# Patient Record
Sex: Female | Born: 1940 | Race: White | Hispanic: No | Smoking: Current every day smoker
Health system: Southern US, Community
[De-identification: ages and names within clinical notes are randomized; demographics above are authoritative.]

## PROBLEM LIST (undated history)

## (undated) DIAGNOSIS — J189 Pneumonia, unspecified organism: Secondary | ICD-10-CM

## (undated) DIAGNOSIS — G8929 Other chronic pain: Secondary | ICD-10-CM

## (undated) DIAGNOSIS — I739 Peripheral vascular disease, unspecified: Secondary | ICD-10-CM

## (undated) DIAGNOSIS — M549 Dorsalgia, unspecified: Secondary | ICD-10-CM

## (undated) DIAGNOSIS — Z720 Tobacco use: Secondary | ICD-10-CM

## (undated) DIAGNOSIS — K219 Gastro-esophageal reflux disease without esophagitis: Secondary | ICD-10-CM

## (undated) DIAGNOSIS — R091 Pleurisy: Secondary | ICD-10-CM

## (undated) DIAGNOSIS — I998 Other disorder of circulatory system: Secondary | ICD-10-CM

## (undated) DIAGNOSIS — I70229 Atherosclerosis of native arteries of extremities with rest pain, unspecified extremity: Secondary | ICD-10-CM

## (undated) HISTORY — DX: Atherosclerosis of native arteries of extremities with rest pain, unspecified extremity: I70.229

## (undated) HISTORY — PX: TOTAL ABDOMINAL HYSTERECTOMY: SHX209

## (undated) HISTORY — PX: TOTAL KNEE ARTHROPLASTY: SHX125

## (undated) HISTORY — DX: Tobacco use: Z72.0

## (undated) HISTORY — PX: TONSILLECTOMY: SUR1361

## (undated) HISTORY — DX: Other disorder of circulatory system: I99.8

## (undated) HISTORY — PX: JOINT REPLACEMENT: SHX530

## (undated) HISTORY — PX: INCONTINENCE SURGERY: SHX676

---
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
## 1979-07-01 HISTORY — PX: ABDOMINAL EXPOSURE: SHX5708

## 1998-03-26 ENCOUNTER — Other Ambulatory Visit: Admission: RE | Admit: 1998-03-26 | Discharge: 1998-03-26 | Payer: Self-pay | Admitting: Obstetrics and Gynecology

## 1998-04-05 ENCOUNTER — Inpatient Hospital Stay (HOSPITAL_COMMUNITY): Admission: RE | Admit: 1998-04-05 | Discharge: 1998-04-06 | Payer: Self-pay | Admitting: Obstetrics and Gynecology

## 2001-10-08 ENCOUNTER — Encounter: Admission: RE | Admit: 2001-10-08 | Discharge: 2001-10-08 | Payer: Self-pay | Admitting: Family Medicine

## 2001-10-08 ENCOUNTER — Encounter: Payer: Self-pay | Admitting: Family Medicine

## 2001-12-05 ENCOUNTER — Other Ambulatory Visit: Admission: RE | Admit: 2001-12-05 | Discharge: 2001-12-05 | Payer: Self-pay | Admitting: Obstetrics and Gynecology

## 2001-12-11 ENCOUNTER — Encounter: Payer: Self-pay | Admitting: Orthopedic Surgery

## 2001-12-16 ENCOUNTER — Inpatient Hospital Stay (HOSPITAL_COMMUNITY): Admission: RE | Admit: 2001-12-16 | Discharge: 2001-12-21 | Payer: Self-pay | Admitting: Orthopedic Surgery

## 2002-01-07 ENCOUNTER — Ambulatory Visit (HOSPITAL_COMMUNITY): Admission: RE | Admit: 2002-01-07 | Discharge: 2002-01-07 | Payer: Self-pay | Admitting: Orthopedic Surgery

## 2003-05-07 ENCOUNTER — Other Ambulatory Visit: Admission: RE | Admit: 2003-05-07 | Discharge: 2003-05-07 | Payer: Self-pay | Admitting: Obstetrics and Gynecology

## 2003-06-18 ENCOUNTER — Encounter: Payer: Self-pay | Admitting: Orthopedic Surgery

## 2003-06-22 ENCOUNTER — Inpatient Hospital Stay (HOSPITAL_COMMUNITY): Admission: RE | Admit: 2003-06-22 | Discharge: 2003-06-25 | Payer: Self-pay | Admitting: Orthopedic Surgery

## 2005-06-14 ENCOUNTER — Other Ambulatory Visit: Admission: RE | Admit: 2005-06-14 | Discharge: 2005-06-14 | Payer: Self-pay | Admitting: Obstetrics and Gynecology

## 2006-03-02 ENCOUNTER — Ambulatory Visit (HOSPITAL_COMMUNITY): Admission: RE | Admit: 2006-03-02 | Discharge: 2006-03-03 | Payer: Self-pay | Admitting: Obstetrics and Gynecology

## 2006-08-29 ENCOUNTER — Ambulatory Visit (HOSPITAL_COMMUNITY): Admission: RE | Admit: 2006-08-29 | Discharge: 2006-08-30 | Payer: Self-pay | Admitting: Obstetrics and Gynecology

## 2009-04-30 ENCOUNTER — Emergency Department (HOSPITAL_COMMUNITY): Admission: EM | Admit: 2009-04-30 | Discharge: 2009-04-30 | Payer: Self-pay | Admitting: Emergency Medicine

## 2010-10-08 ENCOUNTER — Emergency Department (HOSPITAL_COMMUNITY)
Admission: EM | Admit: 2010-10-08 | Discharge: 2010-10-08 | Payer: Self-pay | Source: Home / Self Care | Admitting: Emergency Medicine

## 2010-10-15 ENCOUNTER — Observation Stay (HOSPITAL_COMMUNITY)
Admission: EM | Admit: 2010-10-15 | Discharge: 2010-10-16 | Payer: Self-pay | Source: Home / Self Care | Attending: Internal Medicine | Admitting: Internal Medicine

## 2010-10-16 ENCOUNTER — Encounter (INDEPENDENT_AMBULATORY_CARE_PROVIDER_SITE_OTHER): Payer: Self-pay | Admitting: Internal Medicine
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
## 2011-01-09 LAB — POCT CARDIAC MARKERS
CKMB, poc: <1 ng/mL — ABNORMAL LOW (ref 1.0–8.0)
Myoglobin, poc: 34.4 ng/mL (ref 12–200)
Troponin i, poc: <0.05 ng/mL (ref 0.00–0.09)

## 2011-01-09 LAB — CBC
HCT: 38.2 % (ref 36.0–46.0)
Hemoglobin: 12.8 g/dL (ref 12.0–15.0)
MCH: 29.2 pg (ref 26.0–34.0)
MCHC: 33.5 g/dL (ref 30.0–36.0)
MCV: 87.2 fL (ref 78.0–100.0)
Platelets: 214 10*3/uL (ref 150–400)
RBC: 4.38 MIL/uL (ref 3.87–5.11)
RDW: 12.9 % (ref 11.5–15.5)
WBC: 8.4 10*3/uL (ref 4.0–10.5)

## 2011-01-09 LAB — POCT I-STAT, CHEM 8
BUN: 18 mg/dL (ref 6–23)
Calcium, Ion: 1.14 mmol/L (ref 1.12–1.32)
Chloride: 109 mEq/L (ref 96–112)
Creatinine, Ser: 0.8 mg/dL (ref 0.4–1.2)
Glucose, Bld: 114 mg/dL — ABNORMAL HIGH (ref 70–99)
HCT: 38 % (ref 36.0–46.0)
Hemoglobin: 12.9 g/dL (ref 12.0–15.0)
Potassium: 3.8 mEq/L (ref 3.5–5.1)
Sodium: 141 mEq/L (ref 135–145)
TCO2: 25 mmol/L (ref 0–100)

## 2011-01-09 LAB — LIPID PANEL
Cholesterol: 195 mg/dL (ref 0–200)
HDL: 59 mg/dL (ref >39)
LDL Cholesterol: 120 mg/dL — ABNORMAL HIGH (ref 0–99)
Total CHOL/HDL Ratio: 3.3 RATIO
VLDL: 16 mg/dL (ref 0–40)

## 2011-01-09 LAB — CARDIAC PANEL(CRET KIN+CKTOT+MB+TROPI)
Relative Index: INVALID (ref 0.0–2.5)
Total CK: 88 U/L (ref 7–177)
Troponin I: 0.01 ng/mL (ref 0.00–0.06)
Troponin I: 0.03 ng/mL (ref 0.00–0.06)

## 2011-01-09 LAB — CK TOTAL AND CKMB (NOT AT ARMC): CK, MB: 1.9 ng/mL (ref 0.3–4.0)

## 2011-01-09 LAB — TROPONIN I: Troponin I: 0.03 ng/mL (ref 0.00–0.06)

## 2011-03-17 NOTE — Op Note (Signed)
[place]  Patient:    [name], [name] Visit Number: [id number] MRN: [id number]          Service Type: SUR Location: [facility]M 2550 07 Attending Physician:  Alinda Deem Dictated by:   Alinda Deem, M.D. Proc. 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  PROCEDURE:  Right total knee arthroplasty using Osteonics Scorpio components, #7 femur right, #7 tibia, #7 modified medial patellar button, and a 10 mm Scorpio spacer.  SURGEON:  Alinda Deem, M.D.  ASSISTANT:  Dorthula Matas, P.A.-C.  ANESTHESIA:  General endotracheal.  ESTIMATED BLOOD LOSS:  Minimal.  FLUID REPLACEMENT:  1500 cc of crystalloids.  DRAINS PLACED:  Foley catheter and two medium Hemovacs.  INDICATIONS FOR PROCEDURE:  The patient is a 70 year old woman with end stage arthritis of her right greater than left knee, bone-on-bone arthritic changes to the patellofemoral and the medial compartment, who has failed conservative treatment with anti-inflammatory medications, physical therapy, and Cortisone injections.  She desires elective total knee arthroplasty.  Is well aware of the risks and benefits of surgery.  She does not have much in the way of a flexion contracture.  DESCRIPTION OF PROCEDURE:  The patient was identified by arm band, taken to the operating room at [facility].  Appropriate anesthetic monitors were attached, and general endotracheal anesthesia was induced with the patient in the supine position.  A tourniquet was applied high to the right thigh, a lateral post and a foot positioner applied to the table.  The right lower extremity was prepped and draped in the usual sterile fashion from the ankle to the mid  thigh.  Limb wrapped with an Esmarch bandage.  Tourniquet inflated to 350 mmHg, and we began the procedure itself by making an anterior midline incision, 15 cm in length centered over the patella.  Small bleeders in the skin and subcutaneous tissues were identified and cauterized. Retinaculum over the patella was incised and reflected medially, allowing a medial parapatellar arthrotomy.  Prepatellar fat pad and anterior 1/2 of the menisci were resected, as were the cruciate ligaments with the patella everted and the knee flexed.  We then completed our excision of the posterior horns of the meniscus as well, and the tibial spine was removed with a power saw.  The proximal tibia was instrumental with the Osteonics step drill, followed by the intramedullary rod, and a 0 degree sloped proximal tibial cutting guide was pinned in place, allowing resection of 1 to 2 mm of bone medially, and 4 to 5 mm of bone laterally.  We then entered the distal femur with the Osteonics step drill, and the 10 mm distal femoral cutting guide was pinned into place, allowing resection of the distal 10 mm of the medial and lateral femoral condyles.  We then sized for a #7 right femoral cutting guide which was pinned into place in 3 degrees of external rotation in relationship to the epicondylar axis, and then performed our anterior, posterior and chamfer cuts without difficulty, followed by the Scorpio box cut with the box cutting guide.  The upper area of the patella was then grasped with patellar cutting guide, and  the posterior 9 and 10 mm of the patella resected.  We sized for a #7 modified medial patellar button and drilled the patella for this button. Trial #7 right femoral component was then hammered into place.  A #7 tibial base plate with a 10 cc trial spacer was then inserted.  Good flexion and extension gaps were noted.  Range of motion was 0 to about 130 degrees, and the patellar button trial was noted to  tract without any thumb pressure.  At this point, the knee was once again hyperflexed with the patella everted. Trial components removed.  All bony surfaces were water pick cleaned.  Dried with suction and sponges.  A double batch of Halmetic simplex cement with 1500 mg of Zinacef was mixed for 3 minutes, and then applied to the bony and metallic surfaces.  In order, we hammered into place a #7 tibial base plate, a #7 right femoral component, and #7 modified medial patellar button, excess cement was removed.  The 10 mm Scorpio spacer was then hammered into the tibial tray, and the knee reduced, and the cement allowed to cure.  Once again, the wound was lavaged clean with normal saline solution.  Medium Hemovac drains were placed deep in the wound.  We checked the tracking and ligament tension once again, and found to be excellent.  The parapatellar arthrotomy was closed with running #1 Vicryl suture.  The subcutaneous tissue with 0 and 2-0 undyed Vicryl suture, and the skin with skin staples.  A dressing with xeroform, 4 x 4 dressing, sponges, Webril, and an Ace wrap applied.  The patient was then awakened and taken to the recovery room without difficulty. Dictated by:   Alinda Deem, M.D. Attending Physician:  Alinda Deem DD:  12/16/01 TD:  12/16/01 Job: [id number]

## 2011-03-17 NOTE — Op Note (Signed)
NAMEJANINE, [name]            ACCOUNT NO.:  [id number]   MEDICAL RECORD NO.:  [id number]          PATIENT TYPE:  AMB   LOCATION:  SDC                           FACILITY:  [facility]   PHYSICIAN:  [name] Sandifer. Henderson [name], M.D. DATE OF BIRTH:  18-Dec-1940   DATE OF PROCEDURE:  08/29/2006  DATE OF DISCHARGE:                                 OPERATIVE REPORT   PREOPERATIVE DIAGNOSIS:  Cystocele.   POSTOPERATIVE DIAGNOSIS:  Cystocele.   PROCEDURE:  Anterior repair Perigee graft.   SURGEON:  [name] Sandifer. Henderson [name], M.D.   ASSISTANT:  Juluis Mire, M.D.   ANESTHESIA:  General with endotracheal intubation.   ESTIMATED BLOOD LOSS:  Less than 100 mL.   INDICATIONS AND CONSENT:  The patient is 70 year old married white female  G3, P3, status post TAH-BSO, status post A&P repair and then a subsequent  A&P repair in May of this year.  She has recurrent cystocele.  Details are  dictated in the history and physical.  Anterior repair with graft is  discussed preoperatively.  Potential risks and complications have been  reviewed preoperatively including but limited to infection, bowel, bladder,  ureteral damage, bleeding requiring transfusion of blood products and  possible transfusion reaction, HIV and hepatitis acquisition, DVT, PE,  pneumonia, fistula formation, recurrent cystocele, postoperative pain,  dyspareunia and erosion.  All questions were answered and consent is signed  on the chart.   PROCEDURE:  The patient is taken to operating room where she is placed in  dorsal supine position.  General anesthesia is induced via endotracheal  intubation.  She is then placed in dorsal lithotomy position where she is  prepped, bladder straight catheterized and she is draped in sterile fashion.  Weighted speculum was placed.  The anterior vaginal mucosa is then injected  with 0.50% lidocaine with 1:200,000 epinephrine for a total of approximately  18 mL.  The anterior vaginal mucosa was then taken down in  the inverted T-  shaped fashion up to the level of the vesicourethral junction.  This  dissection is then carried bilaterally sharply and bluntly to the ischial  spines bilaterally.  The vulva is then marked bilaterally for the superior  and anterior needle passes.  Local anesthetic is injected at these sites and  small stab incisions are made.  Then beginning with the pink anterior  needles, the needles were passed bilaterally guiding it with the finger  placed through the anterior dissection and exiting without difficulty.  Foley catheter is removed and cystoscopy is carried out.  The bladder is  intact with no evidence of perforation or foreign bodies.  The cystoscope is  then removed and Foley catheter is replaced to drain the bladder.  The  zinform Perigee graft is then attached to the pink tips to the pink needles  which are then withdrawn through the vulvar stab incisions.  Then the gray  needles are passed through the inferior incisions, again guiding them with  the fingertip and exiting lateral to the ischial spines bilaterally.  The  remaining tips of the arms of the Perigee graft are then placed on the ends  of the needle which are then withdrawn through the incision.  The graft is  trimmed to fit.  A single Monocryl 0 suture is used to anchor the center of  the anterior margin of the graft to the suburethral tissues to hold it in  place.  The anterior vaginal mucosa is then closed in a running locking  fashion with 2-0 Monocryl suture.  After adjusting the tension on the arms one last time, the sheaths are  removed from the arms and the arms are trimmed at the level of the skin all  around.  Dermabond is placed on the skin incisions.  All counts correct.  The patient is awakened and taken to recovery room in stable condition.      [name], M.D.  Electronically Signed     JET/MEDQ  D:  08/29/2006  T:  08/29/2006  Job:  [id number]

## 2011-03-17 NOTE — Discharge Summary (Signed)
[name], [name]            ACCOUNT NO.:  [id number]   MEDICAL RECORD NO.:  [id number]          PATIENT TYPE:  OIB   LOCATION:  9311                          FACILITY:  [facility]   PHYSICIAN:  Guy Sandifer. Henderson Cloud, M.D. DATE OF BIRTH:  22-Jun-1941   DATE OF ADMISSION:  03/02/2006  DATE OF DISCHARGE:  03/03/2006                                 DISCHARGE SUMMARY   ADMISSION DIAGNOSIS:  Cystocele.   DISCHARGE DIAGNOSIS:  Cystocele.   PROCEDURE:  On Mar 02, 2006, anterior vaginal repair.   REASON FOR ADMISSION:  The patient is a 70 year old married white female G3,  P3 status post hysterectomy, status post A&P repair with recurrent  cystocele.  Details dictated in the history and physical.  She is admitted  for surgical management.   HOSPITAL COURSE:  The patient is admitted to the hospital and undergoes the  above procedure.  On the evening of surgery, she has excellent pain relief  and is tolerating regular diet well.  On the day of discharge, the Foley has  been removed.  She is voiding without difficulty.  She has used no pain  medication.  She is ambulating and feeling well.   CONDITION ON DISCHARGE:  Good.   DIET:  Regular as tolerated.   ACTIVITY:  No lifting, no operation of automobiles, no vaginal entry.   She is to call the office with problems including not limited to heavy  vaginal bleeding, temperature of 101 degrees, persistent nausea, vomiting or  increasing pain.   DISCHARGE MEDICATIONS:  1.  Darvocet N 100 #30 1-2 p.o. q.6h. p.r.n.  2.  Aleve p.r.n.   FOLLOW UP:  Follow-up is in the office in two weeks.      Guy Sandifer Henderson Cloud, M.D.  Electronically Signed     JET/MEDQ  D:  03/03/2006  T:  03/05/2006  Job:  [id number]

## 2011-03-17 NOTE — H&P (Signed)
NAMEIDELIA, CAUDELL            ACCOUNT NO.:  [id number]   MEDICAL RECORD NO.:  [id number]          PATIENT TYPE:  AMB   LOCATION:  SDC                           FACILITY:  [facility]   PHYSICIAN:  Guy Sandifer. Henderson Cloud, M.D. DATE OF BIRTH:  [birth year]   DATE OF ADMISSION:  08/29/2006  DATE OF DISCHARGE:                                HISTORY & PHYSICAL   CHIEF COMPLAINT:  Recurrent cystocele.   HISTORY OF PRESENT ILLNESS:  This patient is a 70 year old married white  female, G3, P3, status post TAHRSO, status post A&P repair, and status post  subsequent repeat anterior repair, again in May of this year.  She has a  recurrent cystocele which is extremely uncomfortable for her.  Urodynamic  testing prior to her anterior repair in May of this year was significant for  no stress incontinence.  The patient is quite uncomfortable with her  cystocele, and after a careful discussion, is being admitted for an anterior  repair with a Parogy graft.  The potential risks and complications have been  discussed preoperatively and all questions have been answered.   PAST MEDICAL HISTORY:  1. Osteoporosis.  2. Status post right and left leg fracture.  3. Vertebral fractures.   PAST SURGICAL HISTORY:  1. Anterior repair in May 2007.  2. Anterior posterior vaginal repair in 1999.  3. Total abdominal hysterectomy  with right salpingo-oophorectomy and      appendectomy in 1985.  4. Tonsillectomy at age 72.   MEDICATIONS:  Zantac p.r.n.   ALLERGIES:  FOSAMAX makes her throat feel as if it is closing.   SOCIAL HISTORY:  Tobacco:  Smokes one pack a day.  Denies alcohol and drug  abuse.   FAMILY HISTORY:  Positive for liver cancer in mother.  Chronic hypertension  in sister.   OBSTETRIC HISTORY:  Vaginal delivery x3.   REVIEW OF SYSTEMS:  NEUROLOGIC:  Denies headache.  PULMONARY:  Denies  shortness of breath.  GI: Denies recent changes in bowel habits. CARDIAC:  Denies chest pain.   PHYSICAL  EXAMINATION:  VITAL SIGNS:  Height 5 feet 1 inch, weight 148 pounds, blood pressure  126/76.  HEENT:  Without thyromegaly.  LUNGS:  Clear to auscultation.  HEART:  Regular rate and rhythm.  BACK:  Without CVA tenderness.  BREASTS:  Without masses, retraction, or discharge.  ABDOMEN:  Soft, nontender, without masses.  PELVIC:  Vulva and vagina without lesion.  There is a cystocele with loss of  irrigation and bulging to the introitus of the vagina.  Posteriorly, there  is good support. Adnexa nontender without masses, well supported.  The apex  appears to be well supported.  EXTREMITIES:  Grossly within normal limits.  NEUROLOGICAL:  Grossly within normal limits.   ASSESSMENT:  Recurrent cystocele.   PLAN:  Anterior vaginal repair with Parogy graft.      Guy Sandifer Henderson Cloud, M.D.  Electronically Signed     JET/MEDQ  D:  08/21/2006  T:  08/21/2006  Job:  [id number]

## 2011-03-17 NOTE — Discharge Summary (Signed)
NAME:  [name], [name]                      ACCOUNT NO.:  [id number]   MEDICAL RECORD NO.:  [id number]                   PATIENT TYPE:  INP   LOCATION:  5038                                 FACILITY:  [facility]   PHYSICIAN:  Feliberto Gottron. Turner Daniels, M.D.                DATE OF BIRTH:  1940-12-13   DATE OF ADMISSION:  06/22/2003  DATE OF DISCHARGE:  06/25/2003                                 DISCHARGE SUMMARY   PRIMARY DIAGNOSIS:  End-stage degenerative joint disease of the left knee.   PROCEDURE DONE WHILE IN HOSPITAL:  Left total knee arthroplasty.   HISTORY OF PRESENT ILLNESS:  The patient has a many year history of pain in  bilateral knees. She underwent a successful right total knee arthroplasty,  approximately one year before this admission without complication and with  good result. The left knee has continued to have significant pain, not  relieved by conservative measures including anti-inflammatory medications,  cortisone injections, physical therapy, and rest. Because of this, she  wishes to proceed with total knee arthroplasty. X-rays reveal bone on bone  arthritic changes as well as 5-degree flexure contracture and 10-degree  varus deformity.   ALLERGIES:  The patient has had an adverse reaction to FOSAMAX.   MEDICATIONS ON ADMISSION:  Bayer aspirin. Zantac over-the-counter.   PAST MEDICAL HISTORY:  Usual childhood disease. Adult history of DJD of  osteoporosis.   PAST SURGICAL HISTORY:  1. Hysterectomy in 1985.  2. Bladder in 1999.  3. Right total knee arthroplasty February 2003. No difficulties with GET.   SOCIAL HISTORY:  Positive tobacco, one pack per day for 40 years. No  ethanol. No IV drug abuse. She is a Financial controller at [employer]. She is married  and lives with an able-bodied husband.   FAMILY HISTORY:  Mother died at age 46 due to cancer. Father died at 21 of  unknown causes.   REVIEW OF SYSTEMS:  Positive for glasses, decreased hearing, difficulty  swallowing, and upper and lower dentures.   PHYSICAL EXAMINATION:  VITAL SIGNS:  The patient's vital signs are 98.1  degrees temperature, pulse 62, respirations 16, blood pressure 150/80.  GENERAL:  The patient is a 5 foot, 270-pound female.  HEENT:  Head is normocephalic, atraumatic. Ears:  TMs are clear. Eyes:  Pupils are equal, round, and reactive to light and accommodation and light.  Nose is clear. Throat is benign.  NECK:  Supple for range of motion.  CHEST:  Clear to auscultation and percussion.  HEART:  Regular rate and rhythm.  ABDOMEN:  Soft, nontender, no masses, bowel sounds 2+.  EXTREMITIES:  Left knee range of motion 5 to 95 degrees with positive trace  effusion, ligamentously intact, 10 degrees varus deformity and 5 degrees  flexion contracture; positive propios, positive pain with range of motion.  SKIN:  Within normal limits with a healed normal scar on the right knee.   STUDIES:  X-rays  show bone on bone, medial greater than lateral components,  compartments with osteophytes and anterior patellar compartment.  Preoperative labs including a CBC, CMET, chest x-ray, EKG, PT, and PTT were  all within normal limits with the exception of EKG which showed sinus  bradycardia.   HOSPITAL COURSE:  On the date of admission, the patient was taken to the  operating room at [facility] where she underwent a left total knee  arthroplasty using Osteonics Scorpio components,  #7 tibia, #5 femur, #5  modified medial patellar button, and a 12-mm BF spacer. All components  cemented, double patch of Palacos polymethyl methacrylate cement with 1,500  mg of Zinacef in the cement. A medium Hemovac drain was placed double armed  into the knee. The patient was placed on perioperative antibiotics for the  first 48 hours. She was placed on postoperative Coumadin prophylaxis for  target INR of 1.5 to 2 per pharmacy protocol postoperatively. She was also  placed on PCA for pain control  and began physical therapy in the recovery  room with a CPM machine. Postoperative day #1, the patient was awake, alert,  moderate pain, well controlled with PCA. She was taking p.o. fluids well and  began eating. T-max 100.1 ranging to 98 degrees. Dressing was dry. She was  voiding well without difficulty. Blood pressure was somewhat reduced but had  no other symptomology. Intact neurovascularly. She began physical therapy  and occupational therapy on that day with the therapist. On postoperative  day #2, the patient was without complaint. She was afebrile. Vital signs  were stable. Hemoglobin 10.3, INR of 1.3. Dressing was dry. Wound was  benign. Calf was soft. She continued physical therapy. PCA was discontinued.  On postoperative day #3, the patient was ready to go home and was without  complaint. T-max was 98.3. Vital signs were stable. Hemoglobin 10.4. INR  1.6. Dressing was dry. Wound was benign. Calves were soft. She was otherwise  medically stable and orthopedically improved postoperatively. She was stable  with satisfactory postoperative course left total knee arthroplasty.   DISCHARGE INSTRUCTIONS:  She was discharged to home to the care of her  family to return to clinic in one week's time with Dr. Turner Daniels, sooner should  she have any increase in temperature greater than 101, any drainage from the  wound or any fever or 101. She will begin prescription for Tylox and  Coumadin. Activity is weight bearing as tolerated with walker. CPM five  hours a day, will increase by 5 to 10 degrees per day. Dressing changes q.d.  PT at home for ambulation. She may shower postoperative day #7. Diet is  regular.      Laural Benes. Jannet Mantis.                     Feliberto Gottron. Turner Daniels, M.D.    Merita Norton  D:  07/08/2003  T:  07/09/2003  Job:  [id number]

## 2011-03-17 NOTE — H&P (Signed)
NAMEHELON, [name]            ACCOUNT NO.:  [id number]   MEDICAL RECORD NO.:  [id number]          PATIENT TYPE:  AMB   LOCATION:  SDC                           FACILITY:  [facility]   PHYSICIAN:  Guy Sandifer. Henderson Cloud, M.D. DATE OF BIRTH:  April 13, 1941   DATE OF ADMISSION:  03/02/2006  DATE OF DISCHARGE:                                HISTORY & PHYSICAL   CHIEF COMPLAINT:  Cystocele.   HISTORY OF PRESENT ILLNESS:  The patient is a 70 year old married white  female, G3, P3, status post TH RSO, status post subsequent A&P repair, who  now has a symptomatic cystocele that is quite uncomfortable for her.  Urodynamic testing was carried out and was notable for a normal bladder  function.  She still has good support posteriorly.  Anterior-posterior  repair has been discussed with the patient.  The potential risks and  complications have been discussed preoperatively.   PAST MEDICAL HISTORY:  1.  Osteoporosis.  2.  Status post right and left leg fracture.  3.  Vertebral fractures.   PAST SURGICAL HISTORY:  1.  Anterior-posterior vaginal repair in 1999.  2.  Total abdominal hysterectomy with right salpingo-oophorectomy and      appendectomy in 1985.  3.  Tonsillectomy at age 63.   MEDICATIONS:  Vitamins.   ALLERGIES:  FOSAMAX, makes her throat feel as if it is closing.   SOCIAL HISTORY:  Tobacco one pack a day.  Denies alcohol and drug abuse.   FAMILY HISTORY:  Positive for liver cancer in mother, chronic hypertension  in sister.   OBSTETRIC HISTORY:  Vaginal delivery x 3.   REVIEW OF SYSTEMS:  NEUROLOGICAL:  Denies headache.  CARDIOVASCULAR:  Denies  chest pain.  PULMONARY:  Denies shortness of breath.  GI:  Denies recent  changes in bowel habits.   PHYSICAL EXAMINATION:  VITAL SIGNS:  Height 5 feet 1 inch, weight 148 pounds, blood pressure  118/70.  LUNGS:  Clear to auscultation.  HEART:  Regular rate and rhythm.  BACK:  Without CVA tenderness.  BREASTS:  Without masses,  contracture, or discharge.  ABDOMEN:  Soft,  nontender, without masses.  PELVIC:  Vulva and vagina without lesions.  Cystocele is presenting at the  vaginal introitus.  Adnexa nontender without masses.  Rectovaginal exam is  remarkable for good posterior support with adequate rectal sphincter tone.  EXTREMITIES:  Grossly within normal limits.  NEUROLOGICAL:  Grossly within normal limits.   ASSESSMENT:  Recurrent cystocele.   PLAN:  Anterior repair.      Guy Sandifer Henderson Cloud, M.D.  Electronically Signed     JET/MEDQ  D:  02/21/2006  T:  02/21/2006  Job:  [id number]

## 2011-03-17 NOTE — Op Note (Signed)
NAME:  [name], [name]                      ACCOUNT NO.:  [id number]   MEDICAL RECORD NO.:  [id number]                   PATIENT TYPE:  INP   LOCATION:  2899                                 FACILITY:  [facility]   PHYSICIAN:  Feliberto Gottron. Turner Daniels, M.D.                DATE OF BIRTH:  05-10-1941   DATE OF PROCEDURE:  06/22/2003  DATE OF DISCHARGE:                                 OPERATIVE REPORT   PREOPERATIVE DIAGNOSIS:  End stage arthritis, left knee.   POSTOPERATIVE DIAGNOSIS:  End stage arthritis, left knee.   OPERATION PERFORMED:  Left total knee arthroplasty using Osteonics Scorpio  components, a 7 tibia, a 5 femur, 5 modified medial patellar button and a 12  mm  PS spacer.  All components cemented with a double batch of Palacos  polymethyl methacrylate cement with 1500 mg of Zinacef.   SURGEON:  Feliberto Gottron. Turner Daniels, M.D.   ASSISTANT:  1. Harvie Junior, M.D.  2. Laural Benes. Jannet Mantis.   ANESTHESIA:  General endotracheal.   ESTIMATED BLOOD LOSS:  Minimal.   FLUIDS REPLACED:  crystalloids.   TOURNIQUET TIME:  One hour, five minutes.   INDICATIONS FOR PROCEDURE:  The patient is a 70 year old woman who had a  successful right total knee arthroplasty done by me about a year ago, has  end stage arthritis of the left knee with significant 10 degree varus  deformity, bone-on-bone arthritic changes and about a 5 degree flexion  contracture.  Because of pain and loss of function, she desires elective  total knee arthroplasty.  She has also failed anti-inflammatory medicines  and cortisone injections with physical therapy.   DESCRIPTION OF PROCEDURE:  The patient was identified by arm band and take  to the operating room at [facility] main hospital.  Appropriate anesthetic  monitors were attached.  General endotracheal was induced with the patient  in the supine position.  A foot positioner and lateral post applied to the  table.  Tourniquet applied high to the left thigh.  Left lower  extremity  prepped and draped in the usual sterile fashion from the ankle to the  tourniquet with DuraPrep.  Limb wrapped with an Esmarch bandage, tourniquet  inflated to with the knee bent and we began the procedure by making  an anterior midline incision 18 cm in length centered over the patella  through the skin and subcutaneous tissue down to the level of the transverse  retinaculum over the patella which was incised and reflected medially.  We  then performed a medial parapatellar arthrotomy, everted the patella,  resected the prepatellar fat pad as well as the medial and lateral menisci  as we hyperflexed the knee.  The cruciate ligaments were also resected with  the electrocautery and we entered the proximal tibia with Osteonics step  drill followed by the intramedullary guide rod and a 0 degree posterior slow  cutting guide.  Medially,  we removed 3 to 4 mm of bone laterally, 7 to 8 mm  of bone consistent with a varus deformity.  The posterior structures were  protected with a posteromedial Z-retractor, a McHale retractor through the  notch and a Hohmann laterally and the superficial medial and collateral  ligament was elevated from anterior to posterior keeping it intact distally  on the tibia prior to performing the tibial resection.  With the knee  hyperflexed, we then entered the distal femur with the Osteonics step drill.  The intramedullary rod and a 5 degree left distal femoral cutting block was  fixed into place along the epicondylar axis and the distal femoral cut  accomplished.  We then sized for a #5 femoral component and performed our  anterior, posterior and chamfer cuts without difficulty.  A Scorpio box cut  was then accomplished and the everted patella was grasped with a patellar  cutting guide and the posterior 9 mm of the patella resected and sized for a  #5 modified medial patellar button and drill.  A trial 5 left femur was then  hammered into place.  A 7  mm tibial base plate with a 12 mm 7-0 PS spacer  was then placed on the tibia and the knee came to full extension with good  ligamentous stability and flexed easily to 130 degrees with minimal gapping.  At this point the trial components were removed.  All bony surfaces were  waterpicked clean with pulse lavage and then dried with suction and sponges.  A double batch of Palacos, polymethyl methacrylate cement was then mixed  with 1500 mg of Zinacef and applied to all bony and metallic mating surfaces  except for the posterior condyles of the femur itself.  In order, we then  hammered into place, a #7 tibial base plate in the correct rotation along  the second metatarsal ray.  The #5 femoral component was hammered into place  and once again, excess cement was removed.  The 12 PS spacer was then  snapped into place and the knee was held in five degrees of flexion with  compression as we then pressed the #5 modified medial patellar button into  place and removed excess cement.  Once the cement had cured, the knee was  taken through a range of motion with good stability.  Medium Hemovac drains  were then placed in the wound. The parapatellar arthrotomy was closed with  running #1 Vicryl suture, the subcutaneous tissue with 0 and 2-0 undyed  Vicryl suture and the skin with skin staples.  A dressing of Xeroform, 4 x 4  dressing sponges, Webril and Ace wrap was applied.  The patient was awakened  and taken to the recovery room after letting the tourniquet down.                                                Feliberto Gottron. Turner Daniels, M.D.    Ovid Curd  D:  06/22/2003  T:  06/22/2003  Job:  [id number]

## 2011-03-17 NOTE — Op Note (Signed)
[name], [name]            ACCOUNT NO.:  [id number]   MEDICAL RECORD NO.:  [id number]          PATIENT TYPE:  OIB   LOCATION:  9311                          FACILITY:  [facility]   PHYSICIAN:  Guy Sandifer. Henderson Cloud, M.D. DATE OF BIRTH:  08/30/1941   DATE OF PROCEDURE:  03/02/2006  DATE OF DISCHARGE:                                 OPERATIVE REPORT   PREOPERATIVE DIAGNOSIS:  Cystocele.   POSTOPERATIVE DIAGNOSIS:  Cystocele.   PROCEDURE:  Anterior vaginal repair.   SURGEON:  Guy Sandifer. Henderson Cloud, M.D.   ANESTHESIA:  General with LMA, Belva Agee, M.D.   ESTIMATED BLOOD LOSS:  Drops.   INDICATIONS AND CONSENT:  The patient is a 70 year old married white female  G3, P3, status post THRSO and status post subsequent A&P repair who has a  recurrent symptomatic cystocele.  The patient requests surgical management.  Details have been dictated in the history and physical.  Potential risks and  complications have been reviewed preoperatively including but limited to  infection, organ damage, bleeding requiring transfusion of blood products  with possible transfusion reaction, HIV, and hepatitis acquisition, DVT, PE,  pneumonia, fistula formation, postoperative dyspareunia, pelvic pain and  recurrent pelvic relaxation.  All questions were answered, consent was  signed on the chart.   PROCEDURE:  The patient was taken to the operating room where she is  identified, placed in the dorsal supine position, and general anesthesia was  induced via LMA.  She was then placed in dorsal lithotomy position where she  is gently prepped, bladder straight catheterized, and she is draped in a  sterile fashion.  The anterior vaginal wall is infiltrated submucosally with  0.5% Marcaine with 1:200,000 epinephrine.  An inverted T-shaped incision was  then made on the anterior vaginal wall and the mucosa was dissected sharply  and bluntly from the underlying bladder.  Point specific defect repair is  then  carried out with 0 Monocryl suture.  This returns good support  anteriorly.  Excess mucosa is trimmed away and the anterior vagina is closed  in a running locking fashion with 2-0 Monocryl suture.  A Foley catheter was  placed and clear urine is noted.  All counts were correct.  The patient is  awakened and taken to the recovery room in stable condition.      Guy Sandifer Henderson Cloud, M.D.  Electronically Signed     JET/MEDQ  D:  03/02/2006  T:  03/02/2006  Job:  [id number]

## 2011-03-17 NOTE — Discharge Summary (Signed)
[place]. [facility]  Patient:    [name], HAM Visit Number: [id number] MRN: [id number]          Service Type: SUR Location: 5000 [id number] Attending Physician:  Alinda Deem Dictated by:   Dorthula Matas, P.A.-C. Admit Date:  12/16/2001 Discharge Date: 12/21/2001                             Discharge Summary  PRIMARY DIAGNOSIS:  End-stage degenerative joint disease of the right knee.  PROCEDURE:  Right total knee arthroplasty.  HISTORY OF PRESENT ILLNESS:  The patient is a 70 year old woman with end-stage arthritis of her right greater than left knee with bone-on-bone arthritic changes of the patellofemoral medial compartment, who has failed conservative treatment with anti-inflammatory medications, physical therapy, and cortisone injection.  The patient now desires elective total knee arthroplasty aware of the risks and benefits of surgery.  ALLERGIES:  FOSAMAX.  MEDICATIONS:  Aspirin p.r.n. none for weeks, Caltrate and vitamins over-the-counter.  PAST MEDICAL HISTORY:  Usual childhood diseases.  Adult history positive for DJD and osteoporosis.  PAST SURGICAL HISTORY:  Hysterectomy in 1995, bladder surgery in 1999.  No difficulty with GET.  SOCIAL HISTORY:  Positive tobacco one pack per day x40 years.  No ethanol and no IV drug abuse.  She is a Environmental education officer at [employer] and lives with her husband who is able.  FAMILY HISTORY:  Mother died at 79 of cancer.  Father died at 72.  REVIEW OF SYSTEMS:  Positive for glasses, decreased hearing, difficulty swallowing.  Denies any shortness of breath or chest pain.  PHYSICAL EXAMINATION:  VITAL SIGNS: The patient is afebrile, pulse 70, respirations 16, blood pressure 110/80.  GENERAL: The patient is a 5 foot 2 inch 160 pound female.  HEENT: Head is normocephalic and atraumatic.  Ears; TMs are clear.  Eyes; Pupils are equally round and reactive to light and accommodation.  Nose is  clear.  Throat is benign.  NECK: Full range of motion and nontender.  CHEST:  Clear to auscultation and percussion.  HEART: Regular rate and rhythm.  ABDOMEN: Soft and nontender, no masses.  EXTREMITIES: Right knee range of motion 10 to 95 degrees with positive crepitus, 5 to 10 degrees of varus deformity with trace effusion, neurovascularly intact.  SKIN: Within normal limits.  LABORATORY DATA:  X-ray showed bone-on-bone changes of the medial compartment with positive sclerotic changes.  Preoperative labs including CBC, CMET, chest x-ray, EKG, PT, and PTT are all within normal limits.  HOSPITAL COURSE:  On the day of admission the patient was taken to the operating room at [facility] where she underwent a right total knee arthroplasty using Osteonics Scorpio components, #7 femur, #7 tibia, #7 modified medial patellar button, and a 10 mm Scorpio spacer.  Medium hemovac drain was double-armed and placed into the knee.  Foley catheter was also placed.  The patient had a postoperative epidural catheter placed for pain control.  She was placed on perioperative antibiotics for the first 48 hours.  She began Coumadin therapy the day following her surgery per request of anesthesia.  First postoperative day the patient was awake, alert, in moderate pain.  TMAX was 101.3.  Urine output 400 cc per shift, hemovac output 110 cc per shift, hemoglobin 11.  Dressing was dry.  She was neurovascularly intact with epidural in place.  Physical therapy was begun.  Weightbearing as tolerated.  She continues CPM five hours a day.  On postoperative day #2, the patient was complaining of pain not well relieved by the epidural.  She was afebrile.  TMAX had been 100.6.  Vital signs were otherwise stable.  INR 1.5, hemoglobin 9.6.  Dressing was dry.  Hemovac 50 cc q.shift, discontinued without difficulty.  Right calf had positive tenderness, but no firmness or other areas of heat.  Pulses were  intact.  She was otherwise stable orthopedically.  PCA was started because of her poor results with the epidural and seems to be holding back her physical therapy due to her pain. Rehabilitation consult was also obtained during this period because of her slow progress.  Bilateral venous Doppler was also performed because of the patients significant pain in the calf.  This was negative for any type of clot.  Rehabilitation medicine was consulted and by the time she was getting better pain control and was improving with her physical therapy.  On postoperative day #3, the patient was without complaint and was making significant progress with physical therapy.  TMAX was 100.9, vital signs were stable, INR 1.6.  Dressing was dry.  She continued with physical therapy and CPM, discontinued PCA and hep-lock IV.  On postoperative day #4, the patient was awake and alert with moderate pain, eating well.  Up and down the hallway with moderate assist for transfers.  Vital signs were stable.  Hemoglobin 9.4. She continued physical therapy to continue with transfers and steps. Continued with Coumadin and by the fifth postoperative day was "ready to go." She was afebrile, vital signs were stable.  INR 1.8.  Dressing was dry, wound was benign.  Range of motion 5 to 50 degrees and 70 degrees in CPM.  Otherwise neurovascularly intact.  The patient was medically improved and stable and was discharged home to the care of her family.  Home health PT, home health CPM, home health RN.  Dressing changes p.r.n. or after showers.  To be weightbearing as tolerated with a walker.  DISCHARGE MEDICATIONS: Coumadin, Tylox, and Magic Mouthwash due to some suspicious areas that might be early oral thrush.  FOLLOW-UP:  Return to the clinic in one week with Dr. Turner Daniels.  Sooner should she have increase in temperature, drainage, or pain.  DIET:  Regular. Dictated by:   Dorthula Matas, P.A.-C. Attending Physician:   Alinda Deem DD:  01/06/02 TD:  01/08/02 Job: [id number]

## 2011-03-17 NOTE — Procedures (Signed)
[place]. [facility]  Patient:    RUBY, LOGIUDICE Visit Number: [id number] MRN: [id number]          Service Type: SUR Location: RCRM 2550 [id number] Attending Physician:  Alinda Deem Dictated by:   Kaylyn Layer Michelle Piper, M.D. Proc. Date: 12/16/01 Admit Date:  12/16/2001   CC:         Anesthesia Department   Procedure Report  PROCEDURE:  Epidural catheter placement for postoperative pain relief.  DESCRIPTION OF PROCEDURE:  We were consulted by Dr. Gean Birchwood to place an epidural catheter into Ms. Elisabeth Most for postoperative pain relief.  She had the opportunity to discuss the procedure with Maren Beach, M.D., and the risks and benefits were explained and she agreed to the catheter placement.  At the end of the procedure prior to emergence from anesthesia, Dr. Katrinka Blazing left the OR, and I placed the epidural catheter.  Turned the patient to the right lateral decubitus position, prepped her back with Betadine.  I used a #17 Tuohy needle and easily cannulated the L2-3 interspace in the midline using a loss of resistance technique.  The catheter was then inserted 5 cm in the epidural space and the needle removed.  The catheter was thoroughly fixed to the patients back.  After negative aspiration, 100 mcg of fentanyl and 10 cc of 0.5% Xylocaine was injected.  The patient was then turned supine, extubated, and taken to the recovery room in stable condition.  In the PACU, a fentanyl/Marcaine infusion was begun.  She will be followed on the floor by the anesthesiology service. Dictated by:   Kaylyn Layer Michelle Piper, M.D. Attending Physician:  Alinda Deem DD:  12/16/01 TD:  12/16/01 Job: [id number]

## 2014-02-27 DIAGNOSIS — J189 Pneumonia, unspecified organism: Secondary | ICD-10-CM

## 2014-02-27 HISTORY — DX: Pneumonia, unspecified organism: J18.9

## 2014-08-11 ENCOUNTER — Emergency Department (HOSPITAL_COMMUNITY)
Admission: EM | Admit: 2014-08-11 | Discharge: 2014-08-12 | Disposition: A | Discharge status: Home / Self Care | Payer: Medicare Other | Attending: Emergency Medicine | Admitting: Emergency Medicine

## 2014-08-11 ENCOUNTER — Encounter (HOSPITAL_COMMUNITY): Payer: Self-pay | Admitting: Emergency Medicine

## 2014-08-11 DIAGNOSIS — R911 Solitary pulmonary nodule: Secondary | ICD-10-CM | POA: Insufficient documentation

## 2014-08-11 DIAGNOSIS — R918 Other nonspecific abnormal finding of lung field: Secondary | ICD-10-CM

## 2014-08-11 DIAGNOSIS — Z79899 Other long term (current) drug therapy: Secondary | ICD-10-CM | POA: Diagnosis not present

## 2014-08-11 DIAGNOSIS — I700 Atherosclerosis of aorta: Secondary | ICD-10-CM

## 2014-08-11 DIAGNOSIS — K8020 Calculus of gallbladder without cholecystitis without obstruction: Secondary | ICD-10-CM | POA: Diagnosis not present

## 2014-08-11 DIAGNOSIS — Z720 Tobacco use: Secondary | ICD-10-CM | POA: Diagnosis not present

## 2014-08-11 DIAGNOSIS — M545 Low back pain, unspecified: Secondary | ICD-10-CM

## 2014-08-11 DIAGNOSIS — K5900 Constipation, unspecified: Secondary | ICD-10-CM | POA: Diagnosis not present

## 2014-08-11 LAB — CBC WITH DIFFERENTIAL/PLATELET
Basophils Absolute: 0.1 10*3/uL (ref 0.0–0.1)
Basophils Relative: 1 % (ref 0–1)
Eosinophils Absolute: 0.2 10*3/uL (ref 0.0–0.7)
Eosinophils Relative: 2 % (ref 0–5)
HCT: 40.2 % (ref 36.0–46.0)
Hemoglobin: 13.6 g/dL (ref 12.0–15.0)
Lymphocytes Relative: 24 % (ref 12–46)
Lymphs Abs: 2.4 10*3/uL (ref 0.7–4.0)
MCH: 29.5 pg (ref 26.0–34.0)
MCHC: 33.8 g/dL (ref 30.0–36.0)
MCV: 87.2 fL (ref 78.0–100.0)
Monocytes Absolute: 0.7 10*3/uL (ref 0.1–1.0)
Monocytes Relative: 7 % (ref 3–12)
Neutro Abs: 6.5 10*3/uL (ref 1.7–7.7)
Neutrophils Relative %: 66 % (ref 43–77)
Platelets: 233 10*3/uL (ref 150–400)
RBC: 4.61 MIL/uL (ref 3.87–5.11)
RDW: 13.3 % (ref 11.5–15.5)
WBC: 9.8 10*3/uL (ref 4.0–10.5)

## 2014-08-11 MED ORDER — ONDANSETRON HCL 4 MG/2ML IJ SOLN
4.0000 mg | Freq: Once | INTRAMUSCULAR | Status: AC
  Administered 2014-08-11: 4 mg via INTRAVENOUS
  Filled 2014-08-11: qty 2

## 2014-08-11 MED ORDER — MORPHINE SULFATE 2 MG/ML IJ SOLN
2.0000 mg | INTRAMUSCULAR | Status: DC | PRN
  Administered 2014-08-11 – 2014-08-12 (×3): 2 mg via INTRAVENOUS
  Filled 2014-08-11 (×3): qty 1

## 2014-08-11 NOTE — ED Notes (Signed)
Patient arrives with complaint of constipation which began yesterday. States that she was having to push and strain. Was unable to have bowel movement. Eventually was able to have small BM yesterday. Today had very large bowel movement described as "a big booger". Explains that after all the straining she has developed some back pain that has gotten worse. Denies bleeding, fevers, and nausea.
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
## 2014-08-11 NOTE — ED Notes (Signed)
MD at bedside. 

## 2014-08-12 ENCOUNTER — Encounter (HOSPITAL_COMMUNITY): Payer: Self-pay

## 2014-08-12 ENCOUNTER — Emergency Department (HOSPITAL_COMMUNITY): Payer: Medicare Other

## 2014-08-12 LAB — COMPREHENSIVE METABOLIC PANEL
ALT: 10 U/L (ref 0–35)
AST: 16 U/L (ref 0–37)
Albumin: 3.8 g/dL (ref 3.5–5.2)
Alkaline Phosphatase: 83 U/L (ref 39–117)
Anion gap: 15 (ref 5–15)
BILIRUBIN TOTAL: 0.2 mg/dL — AB (ref 0.3–1.2)
BUN: 21 mg/dL (ref 6–23)
CALCIUM: 9.8 mg/dL (ref 8.4–10.5)
CHLORIDE: 105 meq/L (ref 96–112)
CO2: 21 meq/L (ref 19–32)
CREATININE: 0.79 mg/dL (ref 0.50–1.10)
GFR, EST NON AFRICAN AMERICAN: 81 mL/min — AB (ref >90)
GLUCOSE: 96 mg/dL (ref 70–99)
Potassium: 3.8 mEq/L (ref 3.7–5.3)
Sodium: 141 mEq/L (ref 137–147)
Total Protein: 7.4 g/dL (ref 6.0–8.3)

## 2014-08-12 LAB — URINALYSIS, ROUTINE W REFLEX MICROSCOPIC
BILIRUBIN URINE: NEGATIVE
Glucose, UA: NEGATIVE mg/dL
Hgb urine dipstick: NEGATIVE
KETONES UR: NEGATIVE mg/dL
NITRITE: NEGATIVE
PH: 6 (ref 5.0–8.0)
PROTEIN: NEGATIVE mg/dL
Specific Gravity, Urine: 1.023 (ref 1.005–1.030)
Urobilinogen, UA: 0.2 mg/dL (ref 0.0–1.0)

## 2014-08-12 LAB — URINE MICROSCOPIC-ADD ON

## 2014-08-12 LAB — LIPASE, BLOOD: LIPASE: 58 U/L (ref 11–59)

## 2014-08-12 MED ORDER — IOHEXOL 300 MG/ML  SOLN
100.0000 mL | Freq: Once | INTRAMUSCULAR | Status: AC | PRN
  Administered 2014-08-12: 100 mL via INTRAVENOUS

## 2014-08-12 MED ORDER — TRAMADOL HCL 50 MG PO TABS: 50.0000 mg | ORAL_TABLET | Freq: Four times a day (QID) | ORAL | Status: DC | PRN

## 2014-08-12 MED ORDER — IOHEXOL 300 MG/ML  SOLN
25.0000 mL | Freq: Once | INTRAMUSCULAR | Status: AC | PRN
  Administered 2014-08-12: 25 mL via ORAL

## 2014-08-12 MED ORDER — CYCLOBENZAPRINE HCL 7.5 MG PO TABS: 7.5000 mg | ORAL_TABLET | Freq: Two times a day (BID) | ORAL | Status: DC | PRN

## 2014-08-12 MED ORDER — ONDANSETRON HCL 4 MG PO TABS: 4.0000 mg | ORAL_TABLET | Freq: Four times a day (QID) | ORAL | Status: DC

## 2014-08-12 NOTE — ED Notes (Signed)
MD at bedside. 

## 2014-08-12 NOTE — ED Notes (Signed)
CT called to inquire about scan.  

## 2014-08-12 NOTE — ED Notes (Signed)
Dr. Glick at bedside.  

## 2014-08-12 NOTE — ED Notes (Signed)
Unable to obtain pt's signature as system rebooted in room. Pt declined more pain medication prior to leaving. Discharge instructions reviewed with pt and pt's daughter, no questions at this time.

## 2014-08-12 NOTE — Discharge Instructions (Signed)
Your CTscan showed that you have small nodules in the bottom part of both lungs. Radiologist recommends CT of your chest in 6-12 months to make sure that the nodules are not growing. Your primary care doctor can arrnage that for you. You should stop smoking.  Back Pain, Adult Low back pain is very common. About 1 in 5 people have back pain.The cause of low back pain is rarely dangerous. The pain often gets better over time.About half of people with a sudden onset of back pain feel better in just 2 weeks. About 8 in 10 people feel better by 6 weeks.  CAUSES Some common causes of back pain include:  Strain of the muscles or ligaments supporting the spine.  Wear and tear (degeneration) of the spinal discs.  Arthritis.  Direct injury to the back. DIAGNOSIS Most of the time, the direct cause of low back pain is not known.However, back pain can be treated effectively even when the exact cause of the pain is unknown.Answering your caregiver's questions about your overall health and symptoms is one of the most accurate ways to make sure the cause of your pain is not dangerous. If your caregiver needs more information, he or she may order lab work or imaging tests (X-rays or MRIs).However, even if imaging tests show changes in your back, this usually does not require surgery. HOME CARE INSTRUCTIONS For many people, back pain returns.Since low back pain is rarely dangerous, it is often a condition that people can learn to [facility] their own.   Remain active. It is stressful on the back to sit or stand in one place. Do not sit, drive, or stand in one place for more than 30 minutes at a time. Take short walks on level surfaces as soon as pain allows.Try to increase the length of time you walk each day.  Do not stay in bed.Resting more than 1 or 2 days can delay your recovery.  Do not avoid exercise or work.Your body is made to move.It is not dangerous to be active, even though your back may  hurt.Your back will likely heal faster if you return to being active before your pain is gone.  Pay attention to your body when you bend and lift. Many people have less discomfortwhen lifting if they bend their knees, keep the load close to their bodies,and avoid twisting. Often, the most comfortable positions are those that put less stress on your recovering back.  Find a comfortable position to sleep. Use a firm mattress and lie on your side with your knees slightly bent. If you lie on your back, put a pillow under your knees.  Only take over-the-counter or prescription medicines as directed by your caregiver. Over-the-counter medicines to reduce pain and inflammation are often the most helpful.Your caregiver may prescribe muscle relaxant drugs.These medicines help dull your pain so you can more quickly return to your normal activities and healthy exercise.  Put ice on the injured area.  Put ice in a plastic bag.  Place a towel between your skin and the bag.  Leave the ice on for 15-20 minutes, 03-04 times a day for the first 2 to 3 days. After that, ice and heat may be alternated to reduce pain and spasms.  Ask your caregiver about trying back exercises and gentle massage. This may be of some benefit.  Avoid feeling anxious or stressed.Stress increases muscle tension and can worsen back pain.It is important to recognize when you are anxious or stressed and learn ways  to manage it.Exercise is a great option. SEEK MEDICAL CARE IF:  You have pain that is not relieved with rest or medicine.  You have pain that does not improve in 1 week.  You have new symptoms.  You are generally not feeling well. SEEK IMMEDIATE MEDICAL CARE IF:   You have pain that radiates from your back into your legs.  You develop new bowel or bladder control problems.  You have unusual weakness or numbness in your arms or legs.  You develop nausea or vomiting.  You develop abdominal pain.  You  feel faint. Document Released: 10/16/2005 Document Revised: 04/16/2012 Document Reviewed: 02/17/2014 [facility] Patient Information 2015 [place], [place]. This information is not intended to replace advice given to you by your health care provider. Make sure you discuss any questions you have with your health care provider.  Tramadol tablets What is this medicine? TRAMADOL (TRA ma dole) is a pain reliever. It is used to treat moderate to severe pain in adults. This medicine may be used for other purposes; ask your health care provider or pharmacist if you have questions. COMMON BRAND NAME(S): Ultram What should I tell my health care provider before I take this medicine? They need to know if you have any of these conditions: -brain tumor -depression -drug abuse or addiction -head injury -if you frequently drink alcohol containing drinks -kidney disease or trouble passing urine -liver disease -lung disease, asthma, or breathing problems -seizures or epilepsy -suicidal thoughts, plans, or attempt; a previous suicide attempt by you or a family member -an unusual or allergic reaction to tramadol, codeine, other medicines, foods, dyes, or preservatives -pregnant or trying to get pregnant -breast-feeding How should I use this medicine? Take this medicine by mouth with a full glass of water. Follow the directions on the prescription label. If the medicine upsets your stomach, take it with food or milk. Do not take more medicine than you are told to take. Talk to your pediatrician regarding the use of this medicine in children. Special care may be needed. Overdosage: If you think you have taken too much of this medicine contact a poison control center or emergency room at once. NOTE: This medicine is only for you. Do not share this medicine with others. What if I miss a dose? If you miss a dose, take it as soon as you can. If it is almost time for your next dose, take only that dose. Do not take  double or extra doses. What may interact with this medicine? Do not take this medicine with any of the following medications: -MAOIs like Carbex, Eldepryl, Marplan, Nardil, and Parnate This medicine may also interact with the following medications: -alcohol or medicines that contain alcohol -antihistamines -benzodiazepines -bupropion -carbamazepine or oxcarbazepine -clozapine -cyclobenzaprine -digoxin -furazolidone -linezolid -medicines for depression, anxiety, or psychotic disturbances -medicines for migraine headache like almotriptan, eletriptan, frovatriptan, naratriptan, rizatriptan, sumatriptan, zolmitriptan -medicines for pain like pentazocine, buprenorphine, butorphanol, meperidine, nalbuphine, and propoxyphene -medicines for sleep -muscle relaxants -naltrexone -phenobarbital -phenothiazines like perphenazine, thioridazine, chlorpromazine, mesoridazine, fluphenazine, prochlorperazine, promazine, and trifluoperazine -procarbazine -warfarin This list may not describe all possible interactions. Give your health care provider a list of all the medicines, herbs, non-prescription drugs, or dietary supplements you use. Also tell them if you smoke, drink alcohol, or use illegal drugs. Some items may interact with your medicine. What should I watch for while using this medicine? Tell your doctor or health care professional if your pain does not go away, if it gets worse,  or if you have new or a different type of pain. You may develop tolerance to the medicine. Tolerance means that you will need a higher dose of the medicine for pain relief. Tolerance is normal and is expected if you take this medicine for a long time. Do not suddenly stop taking your medicine because you may develop a severe reaction. Your body becomes used to the medicine. This does NOT mean you are addicted. Addiction is a behavior related to getting and using a drug for a non-medical reason. If you have pain, you have a  medical reason to take pain medicine. Your doctor will tell you how much medicine to take. If your doctor wants you to stop the medicine, the dose will be slowly lowered over time to avoid any side effects. You may get drowsy or dizzy. Do not drive, use machinery, or do anything that needs mental alertness until you know how this medicine affects you. Do not stand or sit up quickly, especially if you are an older patient. This reduces the risk of dizzy or fainting spells. Alcohol can increase or decrease the effects of this medicine. Avoid alcoholic drinks. You may have constipation. Try to have a bowel movement at least every 2 to 3 days. If you do not have a bowel movement for 3 days, call your doctor or health care professional. Your mouth may get dry. Chewing sugarless gum or sucking hard candy, and drinking plenty of water may help. Contact your doctor if the problem does not go away or is severe. What side effects may I notice from receiving this medicine? Side effects that you should report to your doctor or health care professional as soon as possible: -allergic reactions like skin rash, itching or hives, swelling of the face, lips, or tongue -breathing difficulties, wheezing -confusion -itching -light headedness or fainting spells -redness, blistering, peeling or loosening of the skin, including inside the mouth -seizures Side effects that usually do not require medical attention (report to your doctor or health care professional if they continue or are bothersome): -constipation -dizziness -drowsiness -headache -nausea, vomiting This list may not describe all possible side effects. Call your doctor for medical advice about side effects. You may report side effects to FDA at 1-800-FDA-1088. Where should I keep my medicine? Keep out of the reach of children. Store at room temperature between 15 and 30 degrees C (59 and 86 degrees F). Keep container tightly closed. Throw away any unused  medicine after the expiration date. NOTE: This sheet is a summary. It may not cover all possible information. If you have questions about this medicine, talk to your doctor, pharmacist, or health care provider.  2015, Elsevier/Gold Standard. (2010-06-29 11:55:44)  Cyclobenzaprine tablets What is this medicine? CYCLOBENZAPRINE (sye kloe BEN za preen) is a muscle relaxer. It is used to treat muscle pain, spasms, and stiffness. This medicine may be used for other purposes; ask your health care provider or pharmacist if you have questions. COMMON BRAND NAME(S): Fexmid, Flexeril What should I tell my health care provider before I take this medicine? They need to know if you have any of these conditions: -heart disease, irregular heartbeat, or previous heart attack -liver disease -thyroid problem -an unusual or allergic reaction to cyclobenzaprine, tricyclic antidepressants, lactose, other medicines, foods, dyes, or preservatives -pregnant or trying to get pregnant -breast-feeding How should I use this medicine? Take this medicine by mouth with a glass of water. Follow the directions on the prescription label. If this medicine  upsets your stomach, take it with food or milk. Take your medicine at regular intervals. Do not take it more often than directed. Talk to your pediatrician regarding the use of this medicine in children. Special care may be needed. Overdosage: If you think you have taken too much of this medicine contact a poison control center or emergency room at once. NOTE: This medicine is only for you. Do not share this medicine with others. What if I miss a dose? If you miss a dose, take it as soon as you can. If it is almost time for your next dose, take only that dose. Do not take double or extra doses. What may interact with this medicine? Do not take this medicine with any of the following medications: -certain medicines for fungal infections like fluconazole, itraconazole,  ketoconazole, posaconazole, voriconazole -cisapride -dofetilide -dronedarone -droperidol -flecainide -grepafloxacin -halofantrine -levomethadyl -MAOIs like Carbex, Eldepryl, Marplan, Nardil, and Parnate -nilotinib -pimozide -probucol -sertindole -thioridazine -ziprasidone This medicine may also interact with the following medications: -abarelix -alcohol -certain medicines for cancer -certain medicines for depression, anxiety, or psychotic disturbances -certain medicines for infection like alfuzosin, chloroquine, clarithromycin, levofloxacin, mefloquine, pentamidine, troleandomycin -certain medicines for an irregular heart beat -certain medicines used for sleep or numbness during surgery or procedure -contrast dyes -dolasetron -guanethidine -methadone -octreotide -ondansetron -other medicines that prolong the QT interval (cause an abnormal heart rhythm) -palonosetron -phenothiazines like chlorpromazine, mesoridazine, prochlorperazine, thioridazine -tramadol -vardenafil This list may not describe all possible interactions. Give your health care provider a list of all the medicines, herbs, non-prescription drugs, or dietary supplements you use. Also tell them if you smoke, drink alcohol, or use illegal drugs. Some items may interact with your medicine. What should I watch for while using this medicine? Check with your doctor or health care professional if your condition does not improve within 1 to 3 weeks. You may get drowsy or dizzy when you first start taking the medicine or change doses. Do not drive, use machinery, or do anything that may be dangerous until you know how the medicine affects you. Stand or sit up slowly. Your mouth may get dry. Drinking water, chewing sugarless gum, or sucking on hard candy may help. What side effects may I notice from receiving this medicine? Side effects that you should report to your doctor or health care professional as soon as  possible: -allergic reactions like skin rash, itching or hives, swelling of the face, lips, or tongue -chest pain -fast heartbeat -hallucinations -seizures -vomiting Side effects that usually do not require medical attention (report to your doctor or health care professional if they continue or are bothersome): -headache This list may not describe all possible side effects. Call your doctor for medical advice about side effects. You may report side effects to FDA at 1-800-FDA-1088. Where should I keep my medicine? Keep out of the reach of children. Store at room temperature between 15 and 30 degrees C (59 and 86 degrees F). Keep container tightly closed. Throw away any unused medicine after the expiration date. NOTE: This sheet is a summary. It may not cover all possible information. If you have questions about this medicine, talk to your doctor, pharmacist, or health care provider.  2015, Elsevier/Gold Standard. (2013-05-13 12:48:19)  Ondansetron tablets What is this medicine? ONDANSETRON (on DAN se tron) is used to treat nausea and vomiting caused by chemotherapy. It is also used to prevent or treat nausea and vomiting after surgery. This medicine may be used for other purposes; ask your  health care provider or pharmacist if you have questions. COMMON BRAND NAME(S): Zofran What should I tell my health care provider before I take this medicine? They need to know if you have any of these conditions: -heart disease -history of irregular heartbeat -liver disease -low levels of magnesium or potassium in the blood -an unusual or allergic reaction to ondansetron, granisetron, other medicines, foods, dyes, or preservatives -pregnant or trying to get pregnant -breast-feeding How should I use this medicine? Take this medicine by mouth with a glass of water. Follow the directions on your prescription label. Take your doses at regular intervals. Do not take your medicine more often than  directed. Talk to your pediatrician regarding the use of this medicine in children. Special care may be needed. Overdosage: If you think you have taken too much of this medicine contact a poison control center or emergency room at once. NOTE: This medicine is only for you. Do not share this medicine with others. What if I miss a dose? If you miss a dose, take it as soon as you can. If it is almost time for your next dose, take only that dose. Do not take double or extra doses. What may interact with this medicine? Do not take this medicine with any of the following medications: -apomorphine -certain medicines for fungal infections like fluconazole, itraconazole, ketoconazole, posaconazole, voriconazole -cisapride -dofetilide -dronedarone -pimozide -thioridazine -ziprasidone This medicine may also interact with the following medications: -carbamazepine -certain medicines for depression, anxiety, or psychotic disturbances -fentanyl -linezolid -MAOIs like Carbex, Eldepryl, Marplan, Nardil, and Parnate -methylene blue (injected into a vein) -other medicines that prolong the QT interval (cause an abnormal heart rhythm) -phenytoin -rifampicin -tramadol This list may not describe all possible interactions. Give your health care provider a list of all the medicines, herbs, non-prescription drugs, or dietary supplements you use. Also tell them if you smoke, drink alcohol, or use illegal drugs. Some items may interact with your medicine. What should I watch for while using this medicine? Check with your doctor or health care professional right away if you have any sign of an allergic reaction. What side effects may I notice from receiving this medicine? Side effects that you should report to your doctor or health care professional as soon as possible: -allergic reactions like skin rash, itching or hives, swelling of the face, lips or tongue -breathing problems -confusion -dizziness -fast or  irregular heartbeat -feeling faint or lightheaded, falls -fever and chills -loss of balance or coordination -seizures -sweating -swelling of the hands or feet -tightness in the chest -tremors -unusually weak or tired Side effects that usually do not require medical attention (report to your doctor or health care professional if they continue or are bothersome): -constipation or diarrhea -headache This list may not describe all possible side effects. Call your doctor for medical advice about side effects. You may report side effects to FDA at 1-800-FDA-1088. Where should I keep my medicine? Keep out of the reach of children. Store between 2 and 30 degrees C (36 and 86 degrees F). Throw away any unused medicine after the expiration date. NOTE: This sheet is a summary. It may not cover all possible information. If you have questions about this medicine, talk to your doctor, pharmacist, or health care provider.  2015, Elsevier/Gold Standard. (2013-07-23 16:27:45)

## 2014-08-12 NOTE — ED Notes (Signed)
Pain medicine offered, pt declined at this time.

## 2014-08-12 NOTE — ED Notes (Signed)
Contacted CT; pt finished drinking contrast

## 2014-08-12 NOTE — ED Notes (Signed)
CT called to inquire about oral contrast.

## 2014-08-12 NOTE — ED Notes (Signed)
Pt placed on 2L 02, pt's 02 sats reading 91% on RA.  

## 2014-11-03 ENCOUNTER — Other Ambulatory Visit: Payer: Self-pay | Admitting: Orthopedic Surgery

## 2014-11-10 ENCOUNTER — Encounter (HOSPITAL_COMMUNITY)
Admission: RE | Admit: 2014-11-10 | Discharge: 2014-11-10 | Disposition: A | Discharge status: Home / Self Care | Payer: Medicare Other | Source: Ambulatory Visit | Attending: Orthopedic Surgery | Admitting: Orthopedic Surgery

## 2014-11-10 ENCOUNTER — Ambulatory Visit (HOSPITAL_COMMUNITY)
Admission: RE | Admit: 2014-11-10 | Discharge: 2014-11-10 | Disposition: A | Discharge status: Home / Self Care | Payer: Medicare Other | Source: Ambulatory Visit | Attending: Orthopedic Surgery | Admitting: Orthopedic Surgery

## 2014-11-10 ENCOUNTER — Encounter (HOSPITAL_COMMUNITY): Payer: Self-pay

## 2014-11-10 DIAGNOSIS — X58XXXA Exposure to other specified factors, initial encounter: Secondary | ICD-10-CM | POA: Diagnosis not present

## 2014-11-10 DIAGNOSIS — S22009A Unspecified fracture of unspecified thoracic vertebra, initial encounter for closed fracture: Secondary | ICD-10-CM | POA: Diagnosis not present

## 2014-11-10 DIAGNOSIS — Z01818 Encounter for other preprocedural examination: Secondary | ICD-10-CM | POA: Insufficient documentation

## 2014-11-10 HISTORY — DX: Pneumonia, unspecified organism: J18.9

## 2014-11-10 HISTORY — DX: Gastro-esophageal reflux disease without esophagitis: K21.9

## 2014-11-10 LAB — URINALYSIS, ROUTINE W REFLEX MICROSCOPIC
Glucose, UA: NEGATIVE mg/dL
KETONES UR: 15 mg/dL — AB
NITRITE: POSITIVE — AB
PH: 5.5 (ref 5.0–8.0)
Protein, ur: 30 mg/dL — AB
Specific Gravity, Urine: 1.023 (ref 1.005–1.030)
Urobilinogen, UA: 0.2 mg/dL (ref 0.0–1.0)

## 2014-11-10 LAB — COMPREHENSIVE METABOLIC PANEL
ALK PHOS: 90 U/L (ref 39–117)
ALT: 20 U/L (ref 0–35)
ANION GAP: 6 (ref 5–15)
AST: 32 U/L (ref 0–37)
Albumin: 4.2 g/dL (ref 3.5–5.2)
BUN: 20 mg/dL (ref 6–23)
CO2: 20 mmol/L (ref 19–32)
Calcium: 9.6 mg/dL (ref 8.4–10.5)
Chloride: 111 mEq/L (ref 96–112)
Creatinine, Ser: 1.17 mg/dL — ABNORMAL HIGH (ref 0.50–1.10)
GFR calc Af Amer: 53 mL/min — ABNORMAL LOW (ref >90)
GFR calc non Af Amer: 45 mL/min — ABNORMAL LOW (ref >90)
Glucose, Bld: 175 mg/dL — ABNORMAL HIGH (ref 70–99)
Potassium: 3.8 mmol/L (ref 3.5–5.1)
Sodium: 137 mmol/L (ref 135–145)
Total Bilirubin: 0.6 mg/dL (ref 0.3–1.2)
Total Protein: 7.1 g/dL (ref 6.0–8.3)

## 2014-11-10 LAB — CBC WITH DIFFERENTIAL/PLATELET
Basophils Absolute: 0.1 10*3/uL (ref 0.0–0.1)
Basophils Relative: 1 % (ref 0–1)
Eosinophils Absolute: 0.1 10*3/uL (ref 0.0–0.7)
Eosinophils Relative: 1 % (ref 0–5)
HEMATOCRIT: 40.7 % (ref 36.0–46.0)
Hemoglobin: 13.7 g/dL (ref 12.0–15.0)
LYMPHS ABS: 2.5 10*3/uL (ref 0.7–4.0)
LYMPHS PCT: 26 % (ref 12–46)
MCH: 28.9 pg (ref 26.0–34.0)
MCHC: 33.7 g/dL (ref 30.0–36.0)
MCV: 85.9 fL (ref 78.0–100.0)
MONO ABS: 0.5 10*3/uL (ref 0.1–1.0)
MONOS PCT: 5 % (ref 3–12)
NEUTROS ABS: 6.5 10*3/uL (ref 1.7–7.7)
NEUTROS PCT: 67 % (ref 43–77)
Platelets: 284 10*3/uL (ref 150–400)
RBC: 4.74 MIL/uL (ref 3.87–5.11)
RDW: 13.3 % (ref 11.5–15.5)
WBC: 9.6 10*3/uL (ref 4.0–10.5)

## 2014-11-10 LAB — URINE MICROSCOPIC-ADD ON

## 2014-11-10 LAB — TYPE AND SCREEN
ABO/RH(D): B NEG
Antibody Screen: NEGATIVE

## 2014-11-10 LAB — PROTIME-INR
INR: 1.07 (ref 0.00–1.49)
PROTHROMBIN TIME: 14 s (ref 11.6–15.2)

## 2014-11-10 LAB — APTT: aPTT: 30 seconds (ref 24–37)

## 2014-11-10 LAB — ABO/RH: ABO/RH(D): B NEG

## 2014-11-10 LAB — SURGICAL PCR SCREEN
MRSA, PCR: NEGATIVE
Staphylococcus aureus: NEGATIVE

## 2014-11-10 NOTE — Pre-Procedure Instructions (Signed)
Solon PalmDebbie D Geeting  11/10/2014   Your procedure is scheduled on:  Thursday, January 14th  [facility] Admitting at 915 AM.  Call this number if you have problems the morning of surgery: [phone]   Remember:   Do not eat food or drink liquids after midnight.   Take these medicines the morning of surgery with A SIP OF WATER: zantac if needed, tylenol if needed   Do not wear jewelry, make-up or nail polish.  Do not wear lotions, powders, or perfume,deodorant.  Do not shave 48 hours prior to surgery. Men may shave face and neck.  Do not bring valuables to the hospital.  [facility] is not responsible  for any belongings or valuables.               Contacts, dentures or bridgework may not be worn into surgery.  Leave suitcase in the car. After surgery it may be brought to your room.  For patients admitted to the hospital, discharge time is determined by your treatment team.               Patients discharged the day of surgery will not be allowed to drive home.  Please read over the following fact sheets that you were given: Pain Booklet, Coughing and Deep Breathing, Blood Transfusion Information, MRSA Information and Surgical Site Infection Prevention  [place] - Preparing for Surgery  Before surgery, you can play an important role.  Because skin is not sterile, your skin needs to be as free of germs as possible.  You can reduce the number of germs on you skin by washing with CHG (chlorahexidine gluconate) soap before surgery.  CHG is an antiseptic cleaner which kills germs and bonds with the skin to continue killing germs even after washing.  Please DO NOT use if you have an allergy to CHG or antibacterial soaps.  If your skin becomes reddened/irritated stop using the CHG and inform your nurse when you arrive at Short Stay.  Do not shave (including legs and underarms) for at least 48 hours prior to the first CHG shower.  You may shave your face.  Please follow  these instructions carefully:   1.  Shower with CHG Soap the night before surgery and the morning of Surgery.  2.  If you choose to wash your hair, wash your hair first as usual with your normal shampoo.  3.  After you shampoo, rinse your hair and body thoroughly to remove the shampoo.  4.  Use CHG as you would any other liquid soap.  You can apply CHG directly to the skin and wash gently with scrungie or a clean washcloth.  5.  Apply the CHG Soap to your body ONLY FROM THE NECK DOWN.  Do not use on open wounds or open sores.  Avoid contact with your eyes, ears, mouth and genitals (private parts).  Wash genitals (private parts) with your normal soap.  6.  Wash thoroughly, paying special attention to the area where your surgery will be performed.  7.  Thoroughly rinse your body with warm water from the neck down.  8.  DO NOT shower/wash with your normal soap after using and rinsing off the CHG Soap.  9.  Pat yourself dry with a clean towel.            10.  Wear clean pajamas.            11.  Place clean sheets on your bed  the night of your first shower and do not sleep with pets.  Day of Surgery  Do not apply any lotions/deoderants the morning of surgery.  Please wear clean clothes to the hospital/surgery center.

## 2014-11-10 NOTE — Progress Notes (Signed)
Primary - dr. Gaylan Geroldinger No cardiologist ekg 10 years ago possibly

## 2014-11-11 MED ORDER — CEFAZOLIN SODIUM-DEXTROSE 2-3 GM-% IV SOLR
2.0000 g | INTRAVENOUS | Status: AC
  Administered 2014-11-12: 2 g via INTRAVENOUS
  Filled 2014-11-11: qty 50

## 2014-11-11 NOTE — Progress Notes (Signed)
Anesthesia Chart Review:  Patient is a 74 year old female scheduled for T9-10 kyphoplasty on 11/12/14 by Dr. Yevette Edwardsumonski.  History includes smoking, PNA, GERD, bilateral TKA, hysterectomy. PCP is Dr. Blair Heysobert Ehinger.  She is not followed by cardiology, but she was seen by cardiologist Dr. 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  Nausea and bradycardia resolved after Zofran followed by Reglan.  Dr. Andee LinemaneGent did not recommend additional cardiac work-up following a normal echo.  Dilaudid is listed on her allergy list.      11/10/14 EKG: NSR, cannot rule out anterior infarct (age undetermined), non-specific T wave abnormality. HR increased and non-specific inferolateral T wave changes new since 10/16/10. No CV symptoms documented at her PAT visit.  10/16/10 echo: - Left ventricle: Systolic function was normal. The estimated  ejection fraction was in the range of 55% to 65%. - Pulmonary arteries: PA peak pressure: 39mm Hg (S). Impressions: Normal study.  11/10/14 CXR: No active cardiopulmonary disease. Subtle chronic interstitial prominence. Multiple stable spinal compression fractures.  Preoperative labs noted. Non-fasting glucose 175.  No reported history of hyperglycemia or DM2.  Will order a fasting CBG on arrival tomorrow to re-evaluate. UA appears positive.  I called and spoke with office staff at Dr. Marshell Levanumonski's office regarding UA results and [facility], and I left her a voice message.  I also routed results to Dr. Yevette Edwardsumonski. Defer recommendations for abnormal UA to the surgeon.  She will be further evaluated by her assigned anesthesiologist on the day of surgery. If no acute changes then I would anticipate that she could proceed from an anesthesia standpoint.   Velna Ochsllison Ondine Gemme, PA-C [facility] Phone [phone] 11/11/2014 11:19 AM

## 2014-11-12 ENCOUNTER — Ambulatory Visit (HOSPITAL_COMMUNITY): Payer: Medicare Other | Admitting: Vascular Surgery

## 2014-11-12 ENCOUNTER — Encounter (HOSPITAL_COMMUNITY): Payer: Self-pay | Admitting: *Deleted

## 2014-11-12 ENCOUNTER — Ambulatory Visit (HOSPITAL_COMMUNITY): Payer: Medicare Other | Admitting: Certified Registered"

## 2014-11-12 ENCOUNTER — Encounter (HOSPITAL_COMMUNITY)
Admission: RE | Disposition: A | Discharge status: Home / Self Care | Payer: Self-pay | Source: Ambulatory Visit | Attending: Orthopedic Surgery

## 2014-11-12 ENCOUNTER — Inpatient Hospital Stay (HOSPITAL_COMMUNITY)
Admission: RE | Admit: 2014-11-12 | Discharge: 2014-11-13 | DRG: 517 | Disposition: A | Discharge status: Home / Self Care | Payer: Medicare Other | Source: Ambulatory Visit | Attending: Orthopedic Surgery | Admitting: Orthopedic Surgery

## 2014-11-12 ENCOUNTER — Ambulatory Visit (HOSPITAL_COMMUNITY): Payer: Medicare Other

## 2014-11-12 DIAGNOSIS — Z885 Allergy status to narcotic agent status: Secondary | ICD-10-CM | POA: Diagnosis not present

## 2014-11-12 DIAGNOSIS — F17210 Nicotine dependence, cigarettes, uncomplicated: Secondary | ICD-10-CM | POA: Diagnosis present

## 2014-11-12 DIAGNOSIS — Z888 Allergy status to other drugs, medicaments and biological substances status: Secondary | ICD-10-CM

## 2014-11-12 DIAGNOSIS — Z96653 Presence of artificial knee joint, bilateral: Secondary | ICD-10-CM | POA: Diagnosis present

## 2014-11-12 DIAGNOSIS — K219 Gastro-esophageal reflux disease without esophagitis: Secondary | ICD-10-CM | POA: Diagnosis not present

## 2014-11-12 DIAGNOSIS — Z79899 Other long term (current) drug therapy: Secondary | ICD-10-CM | POA: Diagnosis not present

## 2014-11-12 DIAGNOSIS — Z8701 Personal history of pneumonia (recurrent): Secondary | ICD-10-CM | POA: Diagnosis not present

## 2014-11-12 DIAGNOSIS — Z90710 Acquired absence of both cervix and uterus: Secondary | ICD-10-CM | POA: Diagnosis not present

## 2014-11-12 DIAGNOSIS — Z419 Encounter for procedure for purposes other than remedying health state, unspecified: Secondary | ICD-10-CM

## 2014-11-12 DIAGNOSIS — Z9889 Other specified postprocedural states: Secondary | ICD-10-CM

## 2014-11-12 DIAGNOSIS — M4854XA Collapsed vertebra, not elsewhere classified, thoracic region, initial encounter for fracture: Principal | ICD-10-CM | POA: Diagnosis present

## 2014-11-12 HISTORY — PX: KYPHOPLASTY: SHX5884

## 2014-11-12 LAB — GLUCOSE, CAPILLARY
Glucose-Capillary: 100 mg/dL — ABNORMAL HIGH (ref 70–99)
Glucose-Capillary: 85 mg/dL (ref 70–99)

## 2014-11-12 SURGERY — KYPHOPLASTY
Anesthesia: General | Site: Back

## 2014-11-12 MED ORDER — DIAZEPAM 5 MG PO TABS: 5.0000 mg | ORAL_TABLET | Freq: Four times a day (QID) | ORAL | Status: DC | PRN

## 2014-11-12 MED ORDER — PROPOFOL 10 MG/ML IV BOLUS
INTRAVENOUS | Status: DC | PRN
  Administered 2014-11-12: 110 mg via INTRAVENOUS

## 2014-11-12 MED ORDER — SODIUM CHLORIDE 0.9 % IJ SOLN: 3.0000 mL | Freq: Two times a day (BID) | INTRAMUSCULAR | Status: DC

## 2014-11-12 MED ORDER — SODIUM CHLORIDE 0.9 % IV SOLN: 250.0000 mL | INTRAVENOUS | Status: DC

## 2014-11-12 MED ORDER — ROCURONIUM BROMIDE 50 MG/5ML IV SOLN
INTRAVENOUS | Status: AC
  Filled 2014-11-12: qty 1

## 2014-11-12 MED ORDER — PHENYLEPHRINE HCL 10 MG/ML IJ SOLN
INTRAMUSCULAR | Status: DC | PRN
  Administered 2014-11-12 (×3): 40 ug via INTRAVENOUS

## 2014-11-12 MED ORDER — OXYCODONE-ACETAMINOPHEN 5-325 MG PO TABS: 1.0000 | ORAL_TABLET | ORAL | Status: DC | PRN

## 2014-11-12 MED ORDER — LACTATED RINGERS IV SOLN
INTRAVENOUS | Status: DC
  Administered 2014-11-12: 10:00:00 via INTRAVENOUS

## 2014-11-12 MED ORDER — FENTANYL CITRATE 0.05 MG/ML IJ SOLN
INTRAMUSCULAR | Status: AC
  Filled 2014-11-12: qty 2

## 2014-11-12 MED ORDER — BUPIVACAINE-EPINEPHRINE 0.25% -1:200000 IJ SOLN: INTRAMUSCULAR | Status: DC | PRN

## 2014-11-12 MED ORDER — BACITRACIN 500 UNIT/GM EX OINT
TOPICAL_OINTMENT | CUTANEOUS | Status: DC | PRN
  Administered 2014-11-12: 1 via TOPICAL

## 2014-11-12 MED ORDER — POVIDONE-IODINE 7.5 % EX SOLN
Freq: Once | CUTANEOUS | Status: DC
Start: 2014-11-12 — End: 2014-11-12
  Filled 2014-11-12: qty 118

## 2014-11-12 MED ORDER — ACETAMINOPHEN 500 MG PO TABS
1000.0000 mg | ORAL_TABLET | Freq: Four times a day (QID) | ORAL | Status: DC | PRN
  Administered 2014-11-12: 1000 mg via ORAL
  Filled 2014-11-12: qty 2

## 2014-11-12 MED ORDER — BUPIVACAINE-EPINEPHRINE (PF) 0.25% -1:200000 IJ SOLN
INTRAMUSCULAR | Status: AC
  Filled 2014-11-12: qty 30

## 2014-11-12 MED ORDER — LIDOCAINE HCL (CARDIAC) 20 MG/ML IV SOLN
INTRAVENOUS | Status: DC | PRN
  Administered 2014-11-12: 100 mg via INTRAVENOUS

## 2014-11-12 MED ORDER — FENTANYL CITRATE 0.05 MG/ML IJ SOLN
INTRAMUSCULAR | Status: AC
  Filled 2014-11-12: qty 5

## 2014-11-12 MED ORDER — NEOSTIGMINE METHYLSULFATE 10 MG/10ML IV SOLN
INTRAVENOUS | Status: DC | PRN
  Administered 2014-11-12: 3 mg via INTRAVENOUS

## 2014-11-12 MED ORDER — ONDANSETRON HCL 4 MG/2ML IJ SOLN
INTRAMUSCULAR | Status: DC | PRN
  Administered 2014-11-12: 4 mg via INTRAVENOUS

## 2014-11-12 MED ORDER — PHENOL 1.4 % MT LIQD: 1.0000 | OROMUCOSAL | Status: DC | PRN

## 2014-11-12 MED ORDER — IOHEXOL 300 MG/ML  SOLN
INTRAMUSCULAR | Status: DC | PRN
  Administered 2014-11-12: 100 mL

## 2014-11-12 MED ORDER — ROCURONIUM BROMIDE 100 MG/10ML IV SOLN
INTRAVENOUS | Status: DC | PRN
  Administered 2014-11-12: 30 mg via INTRAVENOUS

## 2014-11-12 MED ORDER — MORPHINE SULFATE 2 MG/ML IJ SOLN: 1.0000 mg | INTRAMUSCULAR | Status: DC | PRN

## 2014-11-12 MED ORDER — GLYCOPYRROLATE 0.2 MG/ML IJ SOLN
INTRAMUSCULAR | Status: DC | PRN
  Administered 2014-11-12: 0.4 mg via INTRAVENOUS

## 2014-11-12 MED ORDER — FENTANYL CITRATE 0.05 MG/ML IJ SOLN
INTRAMUSCULAR | Status: DC | PRN
  Administered 2014-11-12 (×3): 50 ug via INTRAVENOUS
  Administered 2014-11-12: 100 ug via INTRAVENOUS

## 2014-11-12 MED ORDER — MENTHOL 3 MG MT LOZG: 1.0000 | LOZENGE | OROMUCOSAL | Status: DC | PRN

## 2014-11-12 MED ORDER — ONDANSETRON HCL 4 MG/2ML IJ SOLN
4.0000 mg | INTRAMUSCULAR | Status: DC | PRN
Start: 2014-11-12 — End: 2014-11-13

## 2014-11-12 MED ORDER — FENTANYL CITRATE 0.05 MG/ML IJ SOLN
25.0000 ug | INTRAMUSCULAR | Status: DC | PRN
  Administered 2014-11-12 (×2): 25 ug via INTRAVENOUS

## 2014-11-12 MED ORDER — BUPIVACAINE HCL (PF) 0.25 % IJ SOLN
INTRAMUSCULAR | Status: AC
  Filled 2014-11-12: qty 30

## 2014-11-12 MED ORDER — LACTATED RINGERS IV SOLN
INTRAVENOUS | Status: DC | PRN
  Administered 2014-11-12 (×2): via INTRAVENOUS

## 2014-11-12 MED ORDER — BACITRACIN ZINC 500 UNIT/GM EX OINT
TOPICAL_OINTMENT | CUTANEOUS | Status: AC
  Filled 2014-11-12: qty 28.35

## 2014-11-12 MED ORDER — PROPOFOL 10 MG/ML IV BOLUS
INTRAVENOUS | Status: AC
  Filled 2014-11-12: qty 20

## 2014-11-12 MED ORDER — SODIUM CHLORIDE 0.9 % IJ SOLN: 3.0000 mL | INTRAMUSCULAR | Status: DC | PRN

## 2014-11-12 MED ORDER — EPHEDRINE SULFATE 50 MG/ML IJ SOLN
INTRAMUSCULAR | Status: DC | PRN
  Administered 2014-11-12 (×2): 5 mg via INTRAVENOUS

## 2014-11-12 SURGICAL SUPPLY — 50 items
BANDAGE ADH SHEER 1  50/CT (GAUZE/BANDAGES/DRESSINGS) ×6 IMPLANT
BLADE SURG 15 STRL LF DISP TIS (BLADE) ×1 IMPLANT
BLADE SURG 15 STRL SS (BLADE) ×2
CEMENT BONE KYPHX HV R (Orthopedic Implant) ×3 IMPLANT
CEMENT KYPHON C01A KIT/MIXER (Cement) ×6 IMPLANT
COVER MAYO STAND STRL (DRAPES) ×3 IMPLANT
COVER SURGICAL LIGHT HANDLE (MISCELLANEOUS) ×3 IMPLANT
CURETTE WEDGE 8.5MM KYPHX (MISCELLANEOUS) IMPLANT
DERMABOND ADVANCED (GAUZE/BANDAGES/DRESSINGS)
DERMABOND ADVANCED .7 DNX12 (GAUZE/BANDAGES/DRESSINGS) IMPLANT
DRAPE C-ARM 42X72 X-RAY (DRAPES) ×3 IMPLANT
DRAPE INCISE IOBAN 66X45 STRL (DRAPES) ×3 IMPLANT
DRAPE LAPAROTOMY T 102X78X121 (DRAPES) ×3 IMPLANT
DRAPE SURG 17X23 STRL (DRAPES) ×12 IMPLANT
DURAPREP 26ML APPLICATOR (WOUND CARE) ×3 IMPLANT
GAUZE SPONGE 4X4 16PLY XRAY LF (GAUZE/BANDAGES/DRESSINGS) ×3 IMPLANT
GLOVE BIO SURGEON STRL SZ7 (GLOVE) ×9 IMPLANT
GLOVE BIO SURGEON STRL SZ8 (GLOVE) ×6 IMPLANT
GLOVE BIOGEL PI IND STRL 6.5 (GLOVE) ×1 IMPLANT
GLOVE BIOGEL PI IND STRL 7.0 (GLOVE) ×2 IMPLANT
GLOVE BIOGEL PI IND STRL 8 (GLOVE) ×2 IMPLANT
GLOVE BIOGEL PI INDICATOR 6.5 (GLOVE) ×2
GLOVE BIOGEL PI INDICATOR 7.0 (GLOVE) ×4
GLOVE BIOGEL PI INDICATOR 8 (GLOVE) ×4
GLOVE ECLIPSE 6.5 STRL STRAW (GLOVE) ×3 IMPLANT
GOWN STRL REUS W/ TWL LRG LVL3 (GOWN DISPOSABLE) ×2 IMPLANT
GOWN STRL REUS W/ TWL XL LVL3 (GOWN DISPOSABLE) ×1 IMPLANT
GOWN STRL REUS W/TWL LRG LVL3 (GOWN DISPOSABLE) ×4
GOWN STRL REUS W/TWL XL LVL3 (GOWN DISPOSABLE) ×2
IV CATH 22GX1 FEP (IV SOLUTION) ×3 IMPLANT
KIT BASIN OR (CUSTOM PROCEDURE TRAY) ×3 IMPLANT
KIT ROOM TURNOVER OR (KITS) ×3 IMPLANT
NEEDLE 22X1 1/2 (OR ONLY) (NEEDLE) IMPLANT
NEEDLE HYPO 25GX1X1/2 BEV (NEEDLE) ×3 IMPLANT
NEEDLE HYPO 25X1 1.5 SAFETY (NEEDLE) IMPLANT
NEEDLE SPNL 18GX3.5 QUINCKE PK (NEEDLE) ×6 IMPLANT
NS IRRIG 1000ML POUR BTL (IV SOLUTION) ×3 IMPLANT
PACK SURGICAL SETUP 50X90 (CUSTOM PROCEDURE TRAY) ×3 IMPLANT
PAD ARMBOARD 7.5X6 YLW CONV (MISCELLANEOUS) ×6 IMPLANT
POSITIONER HEAD PRONE TRACH (MISCELLANEOUS) ×3 IMPLANT
SPONGE GAUZE 4X4 12PLY STER LF (GAUZE/BANDAGES/DRESSINGS) ×3 IMPLANT
SUT MNCRL AB 4-0 PS2 18 (SUTURE) ×3 IMPLANT
SYR BULB IRRIGATION 50ML (SYRINGE) ×3 IMPLANT
SYR CONTROL 10ML LL (SYRINGE) ×3 IMPLANT
TAPE CLOTH SURG 4X10 WHT LF (GAUZE/BANDAGES/DRESSINGS) ×3 IMPLANT
TOWEL OR 17X24 6PK STRL BLUE (TOWEL DISPOSABLE) ×3 IMPLANT
TOWEL OR 17X26 10 PK STRL BLUE (TOWEL DISPOSABLE) ×3 IMPLANT
TRAY KYPHOPAK 15/3 ONESTEP 1ST (MISCELLANEOUS) ×3 IMPLANT
TRAY KYPHOPAK 20/3 ONESTEP CDS (KITS) ×3 IMPLANT
WATER STERILE IRR 1000ML POUR (IV SOLUTION) ×3 IMPLANT

## 2014-11-12 NOTE — Anesthesia Postprocedure Evaluation (Signed)
  Anesthesia Post-op Note  Patient: [name]  Procedure(s) Performed: Procedure(s) with comments: KYPHOPLASTY (N/A) - T9-10 kyphoplasty  Patient Location: PACU  Anesthesia Type:General  Level of Consciousness: awake  Airway and Oxygen Therapy: Patient Spontanous Breathing  Post-op Pain: mild  Post-op Assessment: Post-op Vital signs reviewed  Post-op Vital Signs: Reviewed  Last Vitals:  Filed Vitals:   11/12/14 1506  BP: 118/57  Pulse: 59  Temp:   Resp: 22    Complications: No apparent anesthesia complications

## 2014-11-12 NOTE — Op Note (Signed)
NAMElvis Coil:  Varas, Kimmi            ACCOUNT NO.:  [id number]  MEDICAL RECORD NO.:  [id number]  LOCATION:  3C02C                        FACILITY:  [facility]  PHYSICIAN:  Estill BambergMark Rheda Kassab, MD      DATE OF BIRTH:  May 23, 1941  DATE OF PROCEDURE:  11/12/2014                              OPERATIVE REPORT   PREOPERATIVE DIAGNOSIS: 1. T9 compression fracture. 2. T10 compression fracture.  POSTOPERATIVE DIAGNOSIS: 1. T9 compression fracture. 2. T10 compression fracture.  PROCEDURES: 1. Kyphoplasty, T9. 2. Kyphoplasty, T10.  SURGEON:  Estill BambergMark Pierre Cumpton, MD.  ASSISTANJason Coop:  Kayla McKenzie, PA-C  ANESTHESIA:  General endotracheal anesthesia.  COMPLICATIONS:  None.  DISPOSITION:  Stable.  ESTIMATED BLOOD LOSS:  Minimal.  INDICATIONS FOR SURGERY:  Briefly, Ms. Elisabeth MostStevenson is a very pleasant 74- year-old female who did present to me with severe debilitating pain in her mid back.  The pain began on August 11, 2014.  An MRI did reveal acute compression fractures at T9 and T10.  She did fail conservative care, and we did elect to proceed with the procedure noted above.  OPERATIVE DETAILS:  On November 12, 2014, the patient was brought to surgery and general endotracheal anesthesia was administered.  The patient was placed prone on a well-padded flat Jackson bed with gel rolls.  Antibiotics were given.  All bony prominences were padded.  I then brought in AP and lateral fluoroscopy.  Total 4 tiny stab incisions were made lateral to the pedicles of T9 and T10 levels.  Through the stab incisions, Jamshidi needles were advanced across the T9 and T10 pedicles bilaterally.  Kyphoplasty balloons were introduced through the cannulas.  The kyphoplasty balloons were appropriately inflated.  I was able to restore some degree of the superior endplate at the lower T10 level and minimally at T9.  The balloons were removed.  I then introduced approximately 4 mL of cement into the vertebral bodies at both levels.   I did note excellent interdigitation of the cement.  The cement did not extravasate posteriorly into the spinal canal or anteriorly or laterally.  The cement stayed within the vertebral bodies. I was very pleased with the final appearance on AP and lateral fluoroscopic images.  The cannulas were then removed.  The wounds were irrigated.  The wounds were then closed with 3-0 Monocryl.  Bacitracin and a sterile dressing was then placed.  Jason CoopKayla McKenzie was my assistant for the procedure.     Estill BambergMark Castulo Scarpelli, MD     MD/MEDQ  D:  11/12/2014  T:  11/12/2014  Job:  [id number]

## 2014-11-12 NOTE — Anesthesia Preprocedure Evaluation (Addendum)
Anesthesia Evaluation  Patient identified by MRN, date of birth, ID band Patient awake    Airway Mallampati: II   Neck ROM: Full    Dental   Pulmonary pneumonia -, Current Smoker,  breath sounds clear to auscultation        Cardiovascular negative cardio ROS  Rhythm:Regular Rate:Normal     Neuro/Psych    GI/Hepatic Neg liver ROS, GERD-  ,  Endo/Other  negative endocrine ROS  Renal/GU negative Renal ROS     Musculoskeletal   Abdominal   Peds  Hematology   Anesthesia Other Findings   Reproductive/Obstetrics                            Anesthesia Physical Anesthesia Plan  ASA: III  Anesthesia Plan: General   Post-op Pain Management:    Induction: Intravenous  Airway Management Planned: Oral ETT  Additional Equipment:   Intra-op Plan:   Post-operative Plan: Extubation in OR  Informed Consent: I have reviewed the patients History and Physical, chart, labs and discussed the procedure including the risks, benefits and alternatives for the proposed anesthesia with the patient or authorized representative who has indicated his/her understanding and acceptance.     Plan Discussed with: CRNA and Anesthesiologist  Anesthesia Plan Comments:        Anesthesia Quick Evaluation

## 2014-11-12 NOTE — H&P (Signed)
     PREOPERATIVE H&P  Chief Complaint: back pain  HPI: [name] is a 74 y.o. female who presents with ongoing pain in the mid back  MRI reveals an acute compression fracture at T9 and T10  Patient has failed multiple forms of conservative care and continues to have pain (see office notes for additional details regarding the patient's full course of treatment)  Past Medical History  Diagnosis Date  . Pneumonia     hx  . GERD (gastroesophageal reflux disease)    Past Surgical History  Procedure Laterality Date  . Knee arthroplasty Bilateral   . Abdominal hysterectomy    . Joint replacement Bilateral     knee  . Bladder surgery     History   Social History  . Marital Status: Widowed    Spouse Name: N/A    Number of Children: N/A  . Years of Education: N/A   Social History Main Topics  . Smoking status: Current Every Day Smoker -- 0.75 packs/day for 50 years    Types: Cigarettes  . Smokeless tobacco: Not on file  . Alcohol Use: No  . Drug Use: No  . Sexual Activity: Yes    Birth Control/ Protection: Surgical   Other Topics Concern  . Not on file   Social History Narrative   No family history on file. Allergies  Allergen Reactions  . Alendronate Anaphylaxis  . Codeine Nausea And Vomiting  . Dilaudid [Hydromorphone Hcl] Nausea And Vomiting and Other (See Comments)    Bradycardia  . Oxycodone Nausea And Vomiting  . Tramadol Nausea And Vomiting   Prior to Admission medications   Medication Sig Start Date End Date Taking? Authorizing Provider  acetaminophen (TYLENOL) 500 MG tablet Take 1,000 mg by mouth every 6 (six) hours as needed for mild pain or moderate pain.   Yes Historical Provider, MD  ranitidine (ZANTAC) 150 MG tablet Take 150 mg by mouth daily as needed for heartburn.    Yes Historical Provider, MD  cyclobenzaprine (FEXMID) 7.5 MG tablet Take 1 tablet (7.5 mg total) by mouth 2 (two) times daily as needed for muscle spasms. Patient not  taking: Reported on 11/09/2014 08/12/14   [name], MD  ondansetron (ZOFRAN) 4 MG tablet Take 1 tablet (4 mg total) by mouth every 6 (six) hours. Patient not taking: Reported on 11/09/2014 08/12/14   [name], MD  traMADol (ULTRAM) 50 MG tablet Take 1 tablet (50 mg total) by mouth every 6 (six) hours as needed. Patient not taking: Reported on 11/09/2014 08/12/14   [name], MD     All other systems have been reviewed and were otherwise negative with the exception of those mentioned in the HPI and as above.  Physical Exam: There were no vitals filed for this visit.  General: Alert, no acute distress Cardiovascular: No pedal edema Respiratory: No cyanosis, no use of accessory musculature Skin: No lesions in the area of chief complaint Neurologic: Sensation intact distally Psychiatric: Patient is competent for consent with normal mood and affect Lymphatic: No axillary or cervical lymphadenopathy  MUSCULOSKELETAL: + TTP midback  Assessment/Plan: T9,10 compression fracture Plan for Procedure(s): KYPHOPLASTY T9, T10   Emilee HeroUMONSKI,Bryar Rennie LEONARD, MD 11/12/2014 6:39 AM

## 2014-11-12 NOTE — Transfer of Care (Signed)
Immediate Anesthesia Transfer of Care Note  Patient: [name]  Procedure(s) Performed: Procedure(s) with comments: KYPHOPLASTY (N/A) - T9-10 kyphoplasty  Patient Location: PACU  Anesthesia Type:General  Level of Consciousness: awake  Airway & Oxygen Therapy: Patient Spontanous Breathing and Patient connected to face mask oxygen  Post-op Assessment: Report given to PACU RN, Post -op Vital signs reviewed and stable, Patient moving all extremities and Patient moving all extremities X 4  Post vital signs: Reviewed and stable  Complications: No apparent anesthesia complications

## 2014-11-12 NOTE — Plan of Care (Signed)
Problem: Consults Goal: Diagnosis - Spinal Surgery Outcome: Completed/Met Date Met:  11/12/14 T9-10 KYPHOPLASTY

## 2014-11-12 NOTE — Progress Notes (Signed)
Call to Dr. Judie Petitharlene Edwards to ask if patient may have pain medication.  Order received to give patient her regular po pain med- 1 pill- if needed, but no IV medication or sedation can be given until Dr. Yevette Edwardsumonski and Anesthesia have seen the patient.  Informed her that patient cannot swallow pill without applesauce and asked if ok to crush pill.  She stated that Pt will need to swallow pill whole.

## 2014-11-12 NOTE — Anesthesia Procedure Notes (Signed)
Procedure Name: Intubation Date/Time: 11/12/2014 1:16 PM Performed by: Adonis HousekeeperNGELL, Terrica Duecker M Pre-anesthesia Checklist: Patient identified, Emergency Drugs available, Suction available and Patient being monitored Patient Re-evaluated:Patient Re-evaluated prior to inductionOxygen Delivery Method: Circle system utilized Preoxygenation: Pre-oxygenation with 100% oxygen Intubation Type: IV induction Ventilation: Oral airway inserted - appropriate to patient size Laryngoscope Size: Mac and 4 Grade View: Grade I Tube type: Oral Tube size: 7.0 mm Number of attempts: 1 Airway Equipment and Method: Stylet Placement Confirmation: ETT inserted through vocal cords under direct vision,  positive ETCO2 and breath sounds checked- equal and bilateral Secured at: 22 cm Tube secured with: Tape Dental Injury: Teeth and Oropharynx as per pre-operative assessment

## 2014-11-13 NOTE — Progress Notes (Signed)
Patient alert and oriented, mae's well, voiding adequate amount of urine, swallowing without difficulty, no c/o pain. Patient discharged home with family. Script and discharged instructions given to patient. Patient and family stated understanding of d/c instructions given and has an appointment with MD. Aisha Dastan Krider RN 

## 2014-11-13 NOTE — Progress Notes (Signed)
    Patient doing well, resolved deep LBP px/ache, some muscular tightness and weakness. Has been up and ambulating, tylenol for px. Very pleased with progress, denies N/V/D/SOB/Ch Px.    Physical Exam: BP 122/42 mmHg  Pulse 63  Temp(Src) 98.5 F (36.9 C) (Oral)  Resp 18  Wt 64.864 kg (143 lb)  SpO2 97%  Dressing in place, pt laying in bed comfortably all smiles. SCD's in place, NVI  POD #1 s/p T9/T10 Kyphoplasty   - Encourage ambulation - Extra strength tylenol for pain, Valium for muscle spasms - likely d/c home today  - F/U in office 2 weeks

## 2014-11-18 ENCOUNTER — Encounter (HOSPITAL_COMMUNITY): Payer: Self-pay | Admitting: Orthopedic Surgery

## 2014-11-18 NOTE — Discharge Summary (Signed)
Patient ID: [name] MRN: [id number] DOB/AGE: 1941-06-16 74 y.o.  Admit date: 11/12/2014 Discharge date: 11/13/2014  Admission Diagnoses:  Active Problems:   S/P kyphoplasty   Discharge Diagnoses:  Same  Past Medical History  Diagnosis Date  . Pneumonia     hx  . GERD (gastroesophageal reflux disease)     Surgeries: Procedure(s): KYPHOPLASTY on 11/12/2014   Consultants:  None  Discharged Condition: Improved  Hospital Course: [name] is an 74 y.o. female who was admitted 11/12/2014 for operative treatment of compression fracture. Patient has severe unremitting pain that affects sleep, daily activities, and work/hobbies. After pre-op clearance the patient was taken to the operating room on 11/12/2014 and underwent  Procedure(s): T9 & T10 KYPHOPLASTY.    Patient was given perioperative antibiotics:  Anti-infectives    Start     Dose/Rate Route Frequency Ordered Stop   11/12/14 0600  ceFAZolin (ANCEF) IVPB 2 g/50 mL premix     2 g100 mL/hr over 30 Minutes Intravenous On call to O.R. 11/11/14 1407 11/12/14 1322       Patient was given sequential compression devices, early ambulation to prevent DVT.  Patient benefited maximally from hospital stay and there were no complications.    Recent vital signs: BP 122/42 mmHg  Pulse 63  Temp(Src) 98.5 F (36.9 C) (Oral)  Resp 18  Wt 64.864 kg (143 lb)  SpO2 97%    Discharge Medications:     Medication List    STOP taking these medications        cyclobenzaprine 7.5 MG tablet  Commonly known as:  FEXMID     traMADol 50 MG tablet  Commonly known as:  ULTRAM      TAKE these medications        acetaminophen 500 MG tablet  Commonly known as:  TYLENOL  Take 1,000 mg by mouth every 6 (six) hours as needed for mild pain or moderate pain.     ondansetron 4 MG tablet  Commonly known as:  ZOFRAN  Take 1 tablet (4 mg total) by mouth every 6 (six) hours.     ranitidine 150 MG tablet  Commonly known  as:  ZANTAC  Take 150 mg by mouth daily as needed for heartburn.        Diagnostic Studies: Dg Chest 2 View  11/10/2014   CLINICAL DATA:  Preop evaluation.  EXAM: CHEST  2 VIEW  COMPARISON:  10/15/2010 and MRI thoracic spine 10/18/2014  FINDINGS: Lungs are adequately inflated with subtle bilateral chronic interstitial prominence. No focal consolidation or effusion. Cardiomediastinal silhouette is within normal. There is calcified plaque over the aortic arch. Multiple stable spot compression fractures.  IMPRESSION: No active cardiopulmonary disease.  Subtle chronic interstitial prominence.  Multiple stable spinal compression fractures.   Electronically Signed   By: Elberta Fortis M.D.   On: 11/10/2014 17:33   Dg Thoracic Spine 2 View  11/12/2014   CLINICAL DATA:  74 year old female status post T9 and T10 kyphoplasty.  EXAM: DG C-ARM 61-120 MIN; THORACIC SPINE - 2 VIEW  TECHNIQUE: Two fluoroscopic spot views of the thoracic spine are submitted for evaluation.  FLUOROSCOPY TIME:  2 min and 15 seconds.  COMPARISON:  MRI of the thoracic spine 10/18/2014.  FINDINGS: Two coned in spot views of the mid to lower thoracic spine demonstrate postprocedural changes of kyphoplasty, reportedly at T9 and T10. Compared to preprocedural MRI, there appears to be slight restoration of vertebral body height at both levels. No  extrusion of cement material noted. No complicating features.  IMPRESSION: 1. Intraoperative documentation of kyphoplasty at T9 and T10, as above.   Electronically Signed   By: Trudie Reedaniel  Entrikin M.D.   On: 11/12/2014 15:24   Dg C-arm 1-60 Min  11/12/2014   CLINICAL DATA:  74 year old female status post T9 and T10 kyphoplasty.  EXAM: DG C-ARM 61-120 MIN; THORACIC SPINE - 2 VIEW  TECHNIQUE: Two fluoroscopic spot views of the thoracic spine are submitted for evaluation.  FLUOROSCOPY TIME:  2 min and 15 seconds.  COMPARISON:  MRI of the thoracic spine 10/18/2014.  FINDINGS: Two coned in spot views of the mid  to lower thoracic spine demonstrate postprocedural changes of kyphoplasty, reportedly at T9 and T10. Compared to preprocedural MRI, there appears to be slight restoration of vertebral body height at both levels. No extrusion of cement material noted. No complicating features.  IMPRESSION: 1. Intraoperative documentation of kyphoplasty at T9 and T10, as above.   Electronically Signed   By: Trudie Reedaniel  Entrikin M.D.   On: 11/12/2014 15:24    Disposition: 01-Home or Self Care   POD #1 s/p T9/T10 Kyphoplasty   - Encourage ambulation - Extra strength tylenol for pain, Valium for muscle spasms - F/U in office 2 weeks -Written scripts for pain signed and in chart -D/C instructions sheet printed and in chart -D/C today   Signed: Georga BoraMCKENZIE, Hughey Rittenberry J 11/18/2014, 1:45 PM

## 2014-12-28 ENCOUNTER — Other Ambulatory Visit: Payer: Self-pay | Admitting: Orthopedic Surgery

## 2014-12-29 ENCOUNTER — Inpatient Hospital Stay (HOSPITAL_COMMUNITY)
Admission: RE | Admit: 2014-12-29 | Discharge: 2014-12-29 | Disposition: A | Discharge status: Home / Self Care | Payer: Medicare Other | Source: Ambulatory Visit

## 2014-12-29 NOTE — Progress Notes (Signed)
Call to Dr. Yevette Edwardsumonski, requested that he sign operative orders. Spoke with Misty StanleyLisa

## 2014-12-31 ENCOUNTER — Encounter (HOSPITAL_COMMUNITY): Admission: RE | Payer: Self-pay | Source: Ambulatory Visit

## 2014-12-31 ENCOUNTER — Inpatient Hospital Stay (HOSPITAL_COMMUNITY): Admission: RE | Admit: 2014-12-31 | Payer: Medicare Other | Source: Ambulatory Visit | Admitting: Orthopedic Surgery

## 2014-12-31 SURGERY — KYPHOPLASTY
Anesthesia: General

## 2015-05-25 ENCOUNTER — Ambulatory Visit
Admission: RE | Admit: 2015-05-25 | Discharge: 2015-05-25 | Disposition: A | Discharge status: Home / Self Care | Payer: Medicare Other | Source: Ambulatory Visit | Attending: Family Medicine | Admitting: Family Medicine

## 2015-05-25 ENCOUNTER — Other Ambulatory Visit: Payer: Self-pay | Admitting: Family Medicine

## 2015-05-25 DIAGNOSIS — R52 Pain, unspecified: Secondary | ICD-10-CM

## 2015-05-25 DIAGNOSIS — R609 Edema, unspecified: Secondary | ICD-10-CM

## 2015-06-17 ENCOUNTER — Ambulatory Visit: Payer: Self-pay

## 2015-06-17 ENCOUNTER — Telehealth: Payer: Self-pay | Admitting: *Deleted

## 2015-06-17 ENCOUNTER — Encounter: Payer: Self-pay | Admitting: Podiatry

## 2015-06-17 ENCOUNTER — Ambulatory Visit (INDEPENDENT_AMBULATORY_CARE_PROVIDER_SITE_OTHER): Payer: Medicare Other | Admitting: Podiatry

## 2015-06-17 VITALS — BP 139/72 | HR 76 | Temp 98.6°F | Resp 16

## 2015-06-17 DIAGNOSIS — I739 Peripheral vascular disease, unspecified: Secondary | ICD-10-CM

## 2015-06-17 DIAGNOSIS — L97421 Non-pressure chronic ulcer of left heel and midfoot limited to breakdown of skin: Secondary | ICD-10-CM

## 2015-06-17 MED ORDER — MUPIROCIN 2 % EX OINT: TOPICAL_OINTMENT | CUTANEOUS | Status: DC

## 2015-06-17 MED ORDER — AMOXICILLIN-POT CLAVULANATE 875-125 MG PO TABS: 1.0000 | ORAL_TABLET | Freq: Two times a day (BID) | ORAL | Status: DC

## 2015-06-17 NOTE — Telephone Encounter (Signed)
Faxed referral to Dr. Allyson Sabal and orders for LE arterial dopplers B/L to [phone].

## 2015-06-17 NOTE — Progress Notes (Signed)
   Subjective:    Patient ID: [name], female    DOB: 1941-03-08, 74 y.o.   MRN: [id number]  HPI : she presents today with chief complaint of pain to her left heel. She states that she had a callus which she filed down herself and resulted in a small blister that eventually ulcerated into small hole. She states this been very sore since that time. She estimates this is a little more than a week at this point. She states that the area had been draining and is severely painful. She also relates calf pain in his left lower extremity. She states that she can only walk so far before having to sit down due to cramps in her calf. She states that her fifth toe and fourth toe left foot also hurt. She is a heavy smoker.    Review of Systems  Constitutional: Positive for activity change.  HENT: Positive for hearing loss and sneezing.   Respiratory: Positive for cough.   Musculoskeletal: Positive for back pain and gait problem.  Skin: Positive for color change and wound.       Objective:   Physical Exam : 74 year old white female an apparent pain and an antalgic gait left foot. Demonstrates stable vital signs. She does appear to be slightly short of breath due to a history of chronic bronchitis. Pulses are nonpalpable left lower extremity barely palpable right lower extremity. No edema bilateral. Neurologic sensorium is inset intact and hyper sensate left as opposed to the right. Deep tendon reflexes are not elicitable. Muscle strength normal bilateral. Orthopedic evaluation demonstrates all joints distal to the ankle for range of motion without crepitation. Cutaneous evaluation demonstrates supple a high-grade acute his right foot left foot demonstrates a ischemic ulceration posterior left heel with severe erythema along the lateral aspect of the foot including the toes which are exquisitely tender on palpation. This does not appear to be infection more ischemic changes.        Assessment &  Plan:   assessment: ischemic pain with ulceration left foot. Posterior heel ulceration possibly complicated.  Plan: first thing we need to do is send her for arterial studies bilateral lower extremities. I also started her on Augmentin 875 twice daily she will wash the wound thoroughly every day with Epsom salts and warm water. Apply a small amount of Bactroban ointment which was prescribed today as well she will cover with a aperture pad and a light dressing. I encouraged her to wear open heel shoes as to not irritate the wound. Also encouraged her to sleep with her heels elevated or hanging off the pillow or fraying off the bed. I will follow up with her once her arterial studies are performed or sooner within 2 weeks.

## 2015-06-22 ENCOUNTER — Ambulatory Visit (HOSPITAL_COMMUNITY)
Admission: RE | Admit: 2015-06-22 | Discharge: 2015-06-22 | Disposition: A | Discharge status: Home / Self Care | Payer: Medicare Other | Source: Ambulatory Visit | Attending: Cardiology | Admitting: Cardiology

## 2015-06-22 ENCOUNTER — Other Ambulatory Visit: Payer: Self-pay | Admitting: Podiatry

## 2015-06-22 DIAGNOSIS — F17200 Nicotine dependence, unspecified, uncomplicated: Secondary | ICD-10-CM | POA: Insufficient documentation

## 2015-06-22 DIAGNOSIS — I739 Peripheral vascular disease, unspecified: Secondary | ICD-10-CM

## 2015-06-22 DIAGNOSIS — I70203 Unspecified atherosclerosis of native arteries of extremities, bilateral legs: Secondary | ICD-10-CM | POA: Diagnosis not present

## 2015-06-22 DIAGNOSIS — L97421 Non-pressure chronic ulcer of left heel and midfoot limited to breakdown of skin: Secondary | ICD-10-CM | POA: Diagnosis not present

## 2015-06-23 ENCOUNTER — Encounter: Payer: Self-pay | Admitting: Cardiovascular Disease

## 2015-06-23 ENCOUNTER — Ambulatory Visit (INDEPENDENT_AMBULATORY_CARE_PROVIDER_SITE_OTHER): Payer: Medicare Other | Admitting: Cardiovascular Disease

## 2015-06-23 VITALS — BP 126/64 | HR 76 | Ht 59.0 in | Wt 133.4 lb

## 2015-06-23 DIAGNOSIS — I70229 Atherosclerosis of native arteries of extremities with rest pain, unspecified extremity: Secondary | ICD-10-CM

## 2015-06-23 DIAGNOSIS — I739 Peripheral vascular disease, unspecified: Secondary | ICD-10-CM

## 2015-06-23 DIAGNOSIS — D689 Coagulation defect, unspecified: Secondary | ICD-10-CM | POA: Diagnosis not present

## 2015-06-23 DIAGNOSIS — R5383 Other fatigue: Secondary | ICD-10-CM

## 2015-06-23 DIAGNOSIS — Z79899 Other long term (current) drug therapy: Secondary | ICD-10-CM

## 2015-06-23 DIAGNOSIS — Z720 Tobacco use: Secondary | ICD-10-CM | POA: Diagnosis not present

## 2015-06-23 DIAGNOSIS — I998 Other disorder of circulatory system: Secondary | ICD-10-CM

## 2015-06-23 NOTE — Progress Notes (Signed)
06/23/2015 [name]   1941-09-16  [id number]  Primary Physician Thora Lance, MD Primary Cardiologist: Runell Gess MD Roseanne Reno   HPI:  [name] is a delightful 74 year old thin-appearing widowed Caucasian female mother of 2 daughters, grandmother of 2 grandchildren referred by Dr. Al Corpus , podiatrist, for evaluation of critical limb ischemia. Her primary care physician is Dr. Manus Gunning. She has a history of 40-50 pack years of tobacco abuse currently smoking three quarters of a pack a day. She has no other cardiac risk factors. She's never had a heart attack or stroke and denies chest pain or shortness of breath. She has had a back injury in the past and just retired from working at [employer] in February of this year. She does complain of left lower extremity claudication. She manipulated her left heel approximately 6 weeks ago which is developed into a ischemic/nonhealing ulcer. She had little lower extremity artery Doppler studies performed in our office yesterday revealing a right ABI 0.48 with an occluded right SFA and high-grade right external iliac artery. Her left ABI was 0.24 with an occluded left external leg artery and SFA. I'm going to arrange for her to undergo angiography and potential precarious intervention for limb salvage/critical limb ischemia next week.   Current Outpatient Prescriptions  Medication Sig Dispense Refill  . amoxicillin-clavulanate (AUGMENTIN) 875-125 MG per tablet Take 1 tablet by mouth 2 (two) times daily. 20 tablet 1  . aspirin 500 MG tablet Take 500 mg by mouth as needed for pain.    . Calcium Carbonate-Vitamin D 600-400 MG-UNIT per chew tablet Chew 1 tablet by mouth daily.    . methocarbamol (ROBAXIN) 500 MG tablet Take 500 mg by mouth daily as needed for muscle spasms.   0  . mupirocin ointment (BACTROBAN) 2 % Apply to wound twice a day. 30 g 2  . Naproxen Sodium (ALEVE PO) Take by mouth.    . ranitidine  (ZANTAC) 150 MG tablet Take 150 mg by mouth daily as needed for heartburn.      No current facility-administered medications for this visit.    Allergies  Allergen Reactions  . Alendronate Anaphylaxis  . Actonel [Risedronate]   . Boniva [Ibandronic Acid]   . Codeine Nausea And Vomiting  . Dilaudid [Hydromorphone Hcl] Nausea And Vomiting and Other (See Comments)    Bradycardia  . Oxycodone Nausea And Vomiting  . Tramadol Nausea And Vomiting    Social History   Social History  . Marital Status: Widowed    Spouse Name: N/A  . Number of Children: N/A  . Years of Education: N/A   Occupational History  . Not on file.   Social History Main Topics  . Smoking status: Current Every Day Smoker -- 0.75 packs/day for 50 years    Types: Cigarettes  . Smokeless tobacco: Not on file  . Alcohol Use: No  . Drug Use: No  . Sexual Activity: Yes    Birth Control/ Protection: Surgical   Other Topics Concern  . Not on file   Social History Narrative     Review of Systems: General: negative for chills, fever, night sweats or weight changes.  Cardiovascular: negative for chest pain, dyspnea on exertion, edema, orthopnea, palpitations, paroxysmal nocturnal dyspnea or shortness of breath Dermatological: negative for rash Respiratory: negative for cough or wheezing Urologic: negative for hematuria Abdominal: negative for nausea, vomiting, diarrhea, bright red blood per rectum, melena, or hematemesis Neurologic: negative for visual changes, syncope, or dizziness  All other systems reviewed and are otherwise negative except as noted above.    Blood pressure 162/84, pulse 76, height  (1.499 m), weight 133 lb 6.4 oz (60.51 kg).  General appearance: alert and no distress Neck: no adenopathy, no carotid bruit, no JVD, supple, symmetrical, trachea midline and thyroid not enlarged, symmetric, no tenderness/mass/nodules Lungs: clear to auscultation bilaterally Heart: regular rate and  rhythm, S1, S2 normal, no murmur, click, rub or gallop Extremities: extremities normal, atraumatic, no cyanosis or edema and 1+ right common femoral pulse with trace soft bruit, absent left common femoral pulse.. There was a small nonhealing ulcer left heel.  EKG normal suction was 76 without ST or T-wave changes. I personally reviewed this EKG  ASSESSMENT AND PLAN:   Critical lower limb ischemia [name] presents today as referral from Dr. Al Corpus, her podiatrist, for evaluation of critical limb ischemia.her only risk factor is tobacco abuse. She does have mild claudication in the left. She related her left heel which has since been painful and slow to heal present 6 weeks ago. She saw Dr. Al Corpus who put her on several topical medications as well as anti-biotics. She has some cyanosis around the lateral aspect of her left foot. She had Doppler studies performed in our office yesterday revealing a right ABI 0.48 with high-grade right external iliac artery stenosis and mid right SFA occlusion. Left ABI however was 0.24 with an occluded left external iliac and SFA. We discussed options and have agreed to proceed with outpatient angiography and potential intervention for critical limb ischemia and limb salvage salvage.      Runell Gess MD FACP,FACC,FAHA, [facility] 06/23/2015 10:10 AM

## 2015-06-23 NOTE — Assessment & Plan Note (Signed)
[name] presents today as referral from Dr. Al Corpus, her podiatrist, for evaluation of critical limb ischemia.her only risk factor is tobacco abuse. She does have mild claudication in the left. She related her left heel which has since been painful and slow to heal present 6 weeks ago. She saw Dr. Al Corpus who put her on several topical medications as well as anti-biotics. She has some cyanosis around the lateral aspect of her left foot. She had Doppler studies performed in our office yesterday revealing a right ABI 0.48 with high-grade right external iliac artery stenosis and mid right SFA occlusion. Left ABI however was 0.24 with an occluded left external iliac and SFA. We discussed options and have agreed to proceed with outpatient angiography and potential intervention for critical limb ischemia and limb salvage salvage.

## 2015-06-23 NOTE — Patient Instructions (Signed)
Dr. Berry has ordered a peripheral angiogram to be done at [facility].  This procedure is going to look at the bloodflow in your lower extremities.  If Dr. Berry is able to open up the arteries, you will have to spend one night in the hospital.  If he is not able to open the arteries, you will be able to go home that same day.    After the procedure, you will not be allowed to drive for 3 days or push, pull, or lift anything greater than 10 lbs for one week.    You will be required to have the following tests prior to the procedure:  1. Blood work-the blood work can be done no more than 7 days prior to the procedure.  It can be done at any [facility] lab.  There is one downstairs on the first floor of this building and one in the [facility] building ([address], [address])  2. Chest Xray-the chest xray order has already been placed at the [facility].     *REPS Scott    

## 2015-06-24 ENCOUNTER — Ambulatory Visit
Admission: RE | Admit: 2015-06-24 | Discharge: 2015-06-24 | Disposition: A | Discharge status: Home / Self Care | Payer: Medicare Other | Source: Ambulatory Visit | Attending: Cardiovascular Disease | Admitting: Cardiovascular Disease

## 2015-06-24 DIAGNOSIS — Z720 Tobacco use: Secondary | ICD-10-CM

## 2015-06-24 LAB — PROTIME-INR
INR: 1.09 (ref <1.50)
Prothrombin Time: 14.1 seconds (ref 11.6–15.2)

## 2015-06-24 LAB — CBC
HCT: 40.1 % (ref 36.0–46.0)
Hemoglobin: 13.3 g/dL (ref 12.0–15.0)
MCH: 28.5 pg (ref 26.0–34.0)
MCHC: 33.2 g/dL (ref 30.0–36.0)
MCV: 85.9 fL (ref 78.0–100.0)
MPV: 11 fL (ref 8.6–12.4)
PLATELETS: 239 10*3/uL (ref 150–400)
RBC: 4.67 MIL/uL (ref 3.87–5.11)
RDW: 14 % (ref 11.5–15.5)
WBC: 8 10*3/uL (ref 4.0–10.5)

## 2015-06-24 LAB — BASIC METABOLIC PANEL
BUN: 17 mg/dL (ref 7–25)
CALCIUM: 9.4 mg/dL (ref 8.6–10.4)
CHLORIDE: 106 mmol/L (ref 98–110)
CO2: 22 mmol/L (ref 20–31)
CREATININE: 0.74 mg/dL (ref 0.60–0.93)
GLUCOSE: 93 mg/dL (ref 65–99)
Potassium: 4.4 mmol/L (ref 3.5–5.3)
Sodium: 142 mmol/L (ref 135–146)

## 2015-06-24 LAB — APTT: aPTT: 31 seconds (ref 24–37)

## 2015-06-24 LAB — TSH: TSH: 2.007 u[IU]/mL (ref 0.350–4.500)

## 2015-06-28 ENCOUNTER — Encounter (HOSPITAL_COMMUNITY): Payer: Self-pay | Admitting: Cardiovascular Disease

## 2015-06-28 ENCOUNTER — Encounter (HOSPITAL_COMMUNITY)
Admission: RE | Disposition: A | Discharge status: Home / Self Care | Payer: Medicare Other | Source: Ambulatory Visit | Attending: Cardiovascular Disease

## 2015-06-28 ENCOUNTER — Ambulatory Visit (HOSPITAL_COMMUNITY)
Admission: RE | Admit: 2015-06-28 | Discharge: 2015-06-29 | Disposition: A | Discharge status: Home / Self Care | Payer: Medicare Other | Source: Ambulatory Visit | Attending: Cardiovascular Disease | Admitting: Cardiovascular Disease

## 2015-06-28 DIAGNOSIS — Z7982 Long term (current) use of aspirin: Secondary | ICD-10-CM | POA: Insufficient documentation

## 2015-06-28 DIAGNOSIS — Z23 Encounter for immunization: Secondary | ICD-10-CM | POA: Insufficient documentation

## 2015-06-28 DIAGNOSIS — F17210 Nicotine dependence, cigarettes, uncomplicated: Secondary | ICD-10-CM | POA: Insufficient documentation

## 2015-06-28 DIAGNOSIS — I7092 Chronic total occlusion of artery of the extremities: Secondary | ICD-10-CM | POA: Diagnosis not present

## 2015-06-28 DIAGNOSIS — Z720 Tobacco use: Secondary | ICD-10-CM

## 2015-06-28 DIAGNOSIS — D689 Coagulation defect, unspecified: Secondary | ICD-10-CM

## 2015-06-28 DIAGNOSIS — Z79899 Other long term (current) drug therapy: Secondary | ICD-10-CM

## 2015-06-28 DIAGNOSIS — I70229 Atherosclerosis of native arteries of extremities with rest pain, unspecified extremity: Secondary | ICD-10-CM

## 2015-06-28 DIAGNOSIS — R5383 Other fatigue: Secondary | ICD-10-CM

## 2015-06-28 DIAGNOSIS — I739 Peripheral vascular disease, unspecified: Secondary | ICD-10-CM | POA: Diagnosis present

## 2015-06-28 DIAGNOSIS — I70213 Atherosclerosis of native arteries of extremities with intermittent claudication, bilateral legs: Secondary | ICD-10-CM | POA: Insufficient documentation

## 2015-06-28 DIAGNOSIS — I998 Other disorder of circulatory system: Secondary | ICD-10-CM

## 2015-06-28 HISTORY — DX: Other chronic pain: G89.29

## 2015-06-28 HISTORY — DX: Peripheral vascular disease, unspecified: I73.9

## 2015-06-28 HISTORY — DX: Pleurisy: R09.1

## 2015-06-28 HISTORY — PX: PERIPHERAL VASCULAR CATHETERIZATION: SHX172C

## 2015-06-28 HISTORY — DX: Dorsalgia, unspecified: M54.9

## 2015-06-28 LAB — POCT ACTIVATED CLOTTING TIME
ACTIVATED CLOTTING TIME: 147 s
Activated Clotting Time: 214 seconds
Activated Clotting Time: 245 seconds
Activated Clotting Time: 214 seconds

## 2015-06-28 SURGERY — LOWER EXTREMITY ANGIOGRAPHY
Anesthesia: LOCAL

## 2015-06-28 MED ORDER — HYDRALAZINE HCL 20 MG/ML IJ SOLN: 10.0000 mg | INTRAMUSCULAR | Status: DC | PRN

## 2015-06-28 MED ORDER — CLOPIDOGREL BISULFATE 300 MG PO TABS
ORAL_TABLET | ORAL | Status: AC
  Filled 2015-06-28: qty 1

## 2015-06-28 MED ORDER — SODIUM CHLORIDE 0.9 % WEIGHT BASED INFUSION: 1.0000 mL/kg/h | INTRAVENOUS | Status: DC

## 2015-06-28 MED ORDER — HEPARIN (PORCINE) IN NACL 2-0.9 UNIT/ML-% IJ SOLN
INTRAMUSCULAR | Status: AC
  Filled 2015-06-28: qty 1000

## 2015-06-28 MED ORDER — INFLUENZA VAC SPLIT QUAD 0.5 ML IM SUSY
0.5000 mL | PREFILLED_SYRINGE | INTRAMUSCULAR | Status: AC
  Administered 2015-06-29: 0.5 mL via INTRAMUSCULAR
  Filled 2015-06-28: qty 0.5

## 2015-06-28 MED ORDER — SODIUM CHLORIDE 0.9 % IJ SOLN: 3.0000 mL | INTRAMUSCULAR | Status: DC | PRN

## 2015-06-28 MED ORDER — ASPIRIN 81 MG PO CHEW
CHEWABLE_TABLET | ORAL | Status: AC
  Filled 2015-06-28: qty 1

## 2015-06-28 MED ORDER — FENTANYL CITRATE (PF) 100 MCG/2ML IJ SOLN
INTRAMUSCULAR | Status: DC | PRN
  Administered 2015-06-28 (×3): 25 ug via INTRAVENOUS

## 2015-06-28 MED ORDER — CLOPIDOGREL BISULFATE 300 MG PO TABS
ORAL_TABLET | ORAL | Status: DC | PRN
  Administered 2015-06-28: 300 mg via ORAL

## 2015-06-28 MED ORDER — CLOPIDOGREL BISULFATE 75 MG PO TABS
ORAL_TABLET | ORAL | Status: AC
  Filled 2015-06-28: qty 4

## 2015-06-28 MED ORDER — HEPARIN SODIUM (PORCINE) 1000 UNIT/ML IJ SOLN
INTRAMUSCULAR | Status: DC | PRN
  Administered 2015-06-28: 5000 [IU] via INTRAVENOUS
  Administered 2015-06-28: 2500 [IU] via INTRAVENOUS

## 2015-06-28 MED ORDER — PNEUMOCOCCAL VAC POLYVALENT 25 MCG/0.5ML IJ INJ
0.5000 mL | INJECTION | INTRAMUSCULAR | Status: AC
  Administered 2015-06-29: 10:00:00 0.5 mL via INTRAMUSCULAR
  Filled 2015-06-28: qty 0.5

## 2015-06-28 MED ORDER — ASPIRIN EC 325 MG PO TBEC: 325.0000 mg | DELAYED_RELEASE_TABLET | Freq: Every day | ORAL | Status: DC

## 2015-06-28 MED ORDER — CEFAZOLIN SODIUM 1-5 GM-% IV SOLN
1.0000 g | Freq: Once | INTRAVENOUS | Status: DC
  Filled 2015-06-28: qty 50

## 2015-06-28 MED ORDER — ACETAMINOPHEN 325 MG PO TABS
650.0000 mg | ORAL_TABLET | ORAL | Status: DC | PRN
  Administered 2015-06-28 – 2015-06-29 (×2): 650 mg via ORAL
  Filled 2015-06-28 (×2): qty 2

## 2015-06-28 MED ORDER — FENTANYL CITRATE (PF) 100 MCG/2ML IJ SOLN
INTRAMUSCULAR | Status: AC
  Filled 2015-06-28: qty 4

## 2015-06-28 MED ORDER — LIDOCAINE HCL (PF) 1 % IJ SOLN
INTRAMUSCULAR | Status: AC
  Filled 2015-06-28: qty 30

## 2015-06-28 MED ORDER — SODIUM CHLORIDE 0.9 % IV SOLN
INTRAVENOUS | Status: AC
  Administered 2015-06-28: 19:00:00 via INTRAVENOUS

## 2015-06-28 MED ORDER — HEPARIN SODIUM (PORCINE) 1000 UNIT/ML IJ SOLN
INTRAMUSCULAR | Status: AC
  Filled 2015-06-28: qty 1

## 2015-06-28 MED ORDER — CLOPIDOGREL BISULFATE 75 MG PO TABS
75.0000 mg | ORAL_TABLET | Freq: Every day | ORAL | Status: DC
  Administered 2015-06-29: 75 mg via ORAL
  Filled 2015-06-28: qty 1

## 2015-06-28 MED ORDER — MIDAZOLAM HCL 2 MG/2ML IJ SOLN
INTRAMUSCULAR | Status: DC | PRN
  Administered 2015-06-28: 1 mg via INTRAVENOUS

## 2015-06-28 MED ORDER — ONDANSETRON HCL 4 MG/2ML IJ SOLN: 4.0000 mg | Freq: Four times a day (QID) | INTRAMUSCULAR | Status: DC | PRN

## 2015-06-28 MED ORDER — ASPIRIN 81 MG PO CHEW
81.0000 mg | CHEWABLE_TABLET | ORAL | Status: AC
  Administered 2015-06-28: 81 mg via ORAL

## 2015-06-28 MED ORDER — MIDAZOLAM HCL 2 MG/2ML IJ SOLN
INTRAMUSCULAR | Status: AC
  Filled 2015-06-28: qty 4

## 2015-06-28 MED ORDER — SODIUM CHLORIDE 0.9 % WEIGHT BASED INFUSION
3.0000 mL/kg/h | INTRAVENOUS | Status: DC
  Administered 2015-06-28: 3 mL/kg/h via INTRAVENOUS

## 2015-06-28 SURGICAL SUPPLY — 28 items
BALLN POWERFLX PRO 6X40X80 (BALLOONS) ×2 IMPLANT
BALLOON POWERFLX PRO 5X100X135 (BALLOONS) ×2 IMPLANT
BALLOON POWERFLX PRO 5X20X80 (BALLOONS) ×2 IMPLANT
CATH ANGIO 5F PIGTAIL 100CM (CATHETERS) ×2 IMPLANT
CATH ANGIO 5F PIGTAIL 65CM (CATHETERS) ×2 IMPLANT
CATH CROSS OVER TEMPO 5F (CATHETERS) ×2 IMPLANT
CATH SOFT-VU ST 4F 90CM (CATHETERS) ×2 IMPLANT
CATH VIANCE CROSS STAND 150CM (MICROCATHETER) ×2
CATH VIANCE CROSS STD 150CM (MICROCATHETER) ×1 IMPLANT
GUIDEWIRE ASTATO XS 20G 300CM (WIRE) ×2 IMPLANT
KIT ENCORE 26 ADVANTAGE (KITS) ×2 IMPLANT
KIT PV (KITS) ×2 IMPLANT
SHEATH HIGHFLEX ANSEL 7FR 55CM (SHEATH) ×2 IMPLANT
SHEATH PINNACLE 5F 10CM (SHEATH) ×2 IMPLANT
SHEATH PINNACLE 7F 10CM (SHEATH) ×2 IMPLANT
SHEATH PINNACLE 8F 10CM (SHEATH) ×2 IMPLANT
SHEATH PINNACLE MP 7F 45CM (SHEATH) ×2 IMPLANT
STENT ABSOLUTE PRO 8X100X135 (Permanent Stent) ×2 IMPLANT
STENT SMART CONTROL 8X40X120 (Permanent Stent) ×2 IMPLANT
STOPCOCK MORSE 400PSI 3WAY (MISCELLANEOUS) ×2 IMPLANT
SYRINGE MEDRAD AVANTA MACH 7 (SYRINGE) ×2 IMPLANT
TAPE RADIOPAQUE TURBO (MISCELLANEOUS) ×2 IMPLANT
TRANSDUCER W/STOPCOCK (MISCELLANEOUS) ×2 IMPLANT
TRAY PV CATH (CUSTOM PROCEDURE TRAY) ×2 IMPLANT
TUBING CIL FLEX 10 FLL-RA (TUBING) ×2 IMPLANT
WIRE HITORQ VERSACORE ST 145CM (WIRE) ×2 IMPLANT
WIRE ROSEN-J .035X180CM (WIRE) ×2 IMPLANT
WIRE VERSACORE LOC 115CM (WIRE) ×2 IMPLANT

## 2015-06-28 NOTE — Progress Notes (Signed)
Site area: right groin  Site Prior to Removal:  Level 1, bruising distal to site  Pressure Applied For 45 MINUTES    Minutes Beginning at 1525  Manual:   Yes.    Patient Status During Pull:  Stable, continued to bleed periodically during hold requiring extended period of holding pressure. Tolerated well.   Post Pull Groin Site:  Level 0  Post Pull Instructions Given:  Yes.    Post Pull Pulses Present:  Yes.    Dressing Applied:  Yes.    Comments:  Rechecked frequently during the remainder of shift with no change. Bruising distal to site remains the same, dressing dry and intact.

## 2015-06-28 NOTE — Interval H&P Note (Signed)
History and Physical Interval Note:  06/28/2015 10:54 AM  Solon Palm  has presented today for surgery, with the diagnosis of pvd with claudication  The various methods of treatment have been discussed with the patient and family. After consideration of risks, benefits and other options for treatment, the patient has consented to  Procedure(s): Lower Extremity Angiography (N/A) as a surgical intervention .  The patient's history has been reviewed, patient examined, no change in status, stable for surgery.  I have reviewed the patient's chart and labs.  Questions were answered to the patient's satisfaction.     Nanetta Batty

## 2015-06-28 NOTE — Addendum Note (Signed)
Addended by: Chana Bode on: 06/28/2015 02:13 PM   Modules accepted: Orders

## 2015-06-28 NOTE — H&P (View-Only) (Signed)
06/23/2015 [name]   1941-09-16  [id number]  Primary Physician Thora Lance, MD Primary Cardiologist: Runell Gess MD [name]   HPI:  [name] is a delightful 74 year old thin-appearing widowed Caucasian female mother of 2 daughters, grandmother of 2 grandchildren referred by Dr. Al Corpus , podiatrist, for evaluation of critical limb ischemia. Her primary care physician is Dr. Manus Gunning. She has a history of 40-50 pack years of tobacco abuse currently smoking three quarters of a pack a day. She has no other cardiac risk factors. She's never had a heart attack or stroke and denies chest pain or shortness of breath. She has had a back injury in the past and just retired from working at [employer] in February of this year. She does complain of left lower extremity claudication. She manipulated her left heel approximately 6 weeks ago which is developed into a ischemic/nonhealing ulcer. She had little lower extremity artery Doppler studies performed in our office yesterday revealing a right ABI 0.48 with an occluded right SFA and high-grade right external iliac artery. Her left ABI was 0.24 with an occluded left external leg artery and SFA. I'm going to arrange for her to undergo angiography and potential precarious intervention for limb salvage/critical limb ischemia next week.   Current Outpatient Prescriptions  Medication Sig Dispense Refill  . amoxicillin-clavulanate (AUGMENTIN) 875-125 MG per tablet Take 1 tablet by mouth 2 (two) times daily. 20 tablet 1  . aspirin 500 MG tablet Take 500 mg by mouth as needed for pain.    . Calcium Carbonate-Vitamin D 600-400 MG-UNIT per chew tablet Chew 1 tablet by mouth daily.    . methocarbamol (ROBAXIN) 500 MG tablet Take 500 mg by mouth daily as needed for muscle spasms.   0  . mupirocin ointment (BACTROBAN) 2 % Apply to wound twice a day. 30 g 2  . Naproxen Sodium (ALEVE PO) Take by mouth.    . ranitidine  (ZANTAC) 150 MG tablet Take 150 mg by mouth daily as needed for heartburn.      No current facility-administered medications for this visit.    Allergies  Allergen Reactions  . Alendronate Anaphylaxis  . Actonel [Risedronate]   . Boniva [Ibandronic Acid]   . Codeine Nausea And Vomiting  . Dilaudid [Hydromorphone Hcl] Nausea And Vomiting and Other (See Comments)    Bradycardia  . Oxycodone Nausea And Vomiting  . Tramadol Nausea And Vomiting    Social History   Social History  . Marital Status: Widowed    Spouse Name: N/A  . Number of Children: N/A  . Years of Education: N/A   Occupational History  . Not on file.   Social History Main Topics  . Smoking status: Current Every Day Smoker -- 0.75 packs/day for 50 years    Types: Cigarettes  . Smokeless tobacco: Not on file  . Alcohol Use: No  . Drug Use: No  . Sexual Activity: Yes    Birth Control/ Protection: Surgical   Other Topics Concern  . Not on file   Social History Narrative     Review of Systems: General: negative for chills, fever, night sweats or weight changes.  Cardiovascular: negative for chest pain, dyspnea on exertion, edema, orthopnea, palpitations, paroxysmal nocturnal dyspnea or shortness of breath Dermatological: negative for rash Respiratory: negative for cough or wheezing Urologic: negative for hematuria Abdominal: negative for nausea, vomiting, diarrhea, bright red blood per rectum, melena, or hematemesis Neurologic: negative for visual changes, syncope, or dizziness  All other systems reviewed and are otherwise negative except as noted above.    Blood pressure 162/84, pulse 76, height  (1.499 m), weight 133 lb 6.4 oz (60.51 kg).  General appearance: alert and no distress Neck: no adenopathy, no carotid bruit, no JVD, supple, symmetrical, trachea midline and thyroid not enlarged, symmetric, no tenderness/mass/nodules Lungs: clear to auscultation bilaterally Heart: regular rate and  rhythm, S1, S2 normal, no murmur, click, rub or gallop Extremities: extremities normal, atraumatic, no cyanosis or edema and 1+ right common femoral pulse with trace soft bruit, absent left common femoral pulse.. There was a small nonhealing ulcer left heel.  EKG normal suction was 76 without ST or T-wave changes. I personally reviewed this EKG  ASSESSMENT AND PLAN:   Critical lower limb ischemia [name] presents today as referral from Dr. Al Corpus, her podiatrist, for evaluation of critical limb ischemia.her only risk factor is tobacco abuse. She does have mild claudication in the left. She related her left heel which has since been painful and slow to heal present 6 weeks ago. She saw Dr. Al Corpus who put her on several topical medications as well as anti-biotics. She has some cyanosis around the lateral aspect of her left foot. She had Doppler studies performed in our office yesterday revealing a right ABI 0.48 with high-grade right external iliac artery stenosis and mid right SFA occlusion. Left ABI however was 0.24 with an occluded left external iliac and SFA. We discussed options and have agreed to proceed with outpatient angiography and potential intervention for critical limb ischemia and limb salvage salvage.      Runell Gess MD FACP,FACC,FAHA, [facility] 06/23/2015 10:10 AM

## 2015-06-29 ENCOUNTER — Other Ambulatory Visit: Payer: Self-pay | Admitting: Physician Assistant

## 2015-06-29 DIAGNOSIS — I739 Peripheral vascular disease, unspecified: Secondary | ICD-10-CM

## 2015-06-29 DIAGNOSIS — L97909 Non-pressure chronic ulcer of unspecified part of unspecified lower leg with unspecified severity: Secondary | ICD-10-CM | POA: Diagnosis not present

## 2015-06-29 DIAGNOSIS — I70213 Atherosclerosis of native arteries of extremities with intermittent claudication, bilateral legs: Secondary | ICD-10-CM | POA: Diagnosis not present

## 2015-06-29 DIAGNOSIS — I998 Other disorder of circulatory system: Secondary | ICD-10-CM

## 2015-06-29 LAB — BASIC METABOLIC PANEL
Anion gap: 7 (ref 5–15)
BUN: 22 mg/dL — AB (ref 6–20)
CALCIUM: 8.4 mg/dL — AB (ref 8.9–10.3)
CO2: 22 mmol/L (ref 22–32)
CREATININE: 0.93 mg/dL (ref 0.44–1.00)
Chloride: 110 mmol/L (ref 101–111)
GFR calc Af Amer: >60 mL/min (ref >60)
GFR, EST NON AFRICAN AMERICAN: 60 mL/min — AB (ref >60)
GLUCOSE: 139 mg/dL — AB (ref 65–99)
Potassium: 3.6 mmol/L (ref 3.5–5.1)
SODIUM: 139 mmol/L (ref 135–145)

## 2015-06-29 LAB — CBC
HCT: 32.9 % — ABNORMAL LOW (ref 36.0–46.0)
Hemoglobin: 10.6 g/dL — ABNORMAL LOW (ref 12.0–15.0)
MCH: 28.3 pg (ref 26.0–34.0)
MCHC: 32.2 g/dL (ref 30.0–36.0)
MCV: 88 fL (ref 78.0–100.0)
PLATELETS: 208 10*3/uL (ref 150–400)
RBC: 3.74 MIL/uL — ABNORMAL LOW (ref 3.87–5.11)
RDW: 14.5 % (ref 11.5–15.5)
WBC: 8.7 10*3/uL (ref 4.0–10.5)

## 2015-06-29 MED ORDER — FAMOTIDINE 20 MG PO TABS
20.0000 mg | ORAL_TABLET | Freq: Two times a day (BID) | ORAL | Status: DC
  Administered 2015-06-29: 20 mg via ORAL
  Filled 2015-06-29 (×2): qty 1

## 2015-06-29 MED ORDER — CLOPIDOGREL BISULFATE 75 MG PO TABS: 75.0000 mg | ORAL_TABLET | Freq: Every day | ORAL | Status: DC

## 2015-06-29 MED ORDER — ASPIRIN 81 MG PO TBEC: 81.0000 mg | DELAYED_RELEASE_TABLET | Freq: Every day | ORAL | Status: DC

## 2015-06-29 MED ORDER — ASPIRIN 325 MG PO TABS: 500.0000 mg | ORAL_TABLET | Freq: Four times a day (QID) | ORAL | Status: DC | PRN

## 2015-06-29 MED ORDER — MUPIROCIN 2 % EX OINT
TOPICAL_OINTMENT | Freq: Two times a day (BID) | CUTANEOUS | Status: DC
  Administered 2015-06-29: 06:00:00 via TOPICAL
  Filled 2015-06-29 (×2): qty 22

## 2015-06-29 MED ORDER — ASPIRIN EC 81 MG PO TBEC
81.0000 mg | DELAYED_RELEASE_TABLET | Freq: Every day | ORAL | Status: DC
  Administered 2015-06-29: 10:00:00 81 mg via ORAL
  Filled 2015-06-29: qty 1

## 2015-06-29 MED ORDER — AMOXICILLIN-POT CLAVULANATE 875-125 MG PO TABS: 1.0000 | ORAL_TABLET | Freq: Two times a day (BID) | ORAL | Status: DC

## 2015-06-29 MED ORDER — NAPROXEN 250 MG PO TABS
250.0000 mg | ORAL_TABLET | Freq: Two times a day (BID) | ORAL | Status: DC | PRN
  Administered 2015-06-29: 250 mg via ORAL
  Filled 2015-06-29: qty 1

## 2015-06-29 MED ORDER — BACITRACIN-NEOMYCIN-POLYMYXIN OINTMENT TUBE
TOPICAL_OINTMENT | CUTANEOUS | Status: DC
  Administered 2015-06-29 (×2): via TOPICAL
  Filled 2015-06-29: qty 15

## 2015-06-29 MED FILL — Heparin Sodium (Porcine) 2 Unit/ML in Sodium Chloride 0.9%: INTRAMUSCULAR | Qty: 1000 | Status: AC

## 2015-06-29 MED FILL — Lidocaine HCl Local Preservative Free (PF) Inj 1%: INTRAMUSCULAR | Qty: 30 | Status: AC

## 2015-06-29 NOTE — Discharge Instructions (Addendum)
Keep procedure site clean & dry. If you notice increased pain, swelling, bleeding or pus, call/return!  You may shower, but no soaking baths/hot tubs/pools for 1 week.    STOP TAKING ASPIRIN 500 MG FOR PAIN.  CHECK WITH YOUR REGULAR DOCTOR ON ALTERNATIVES.

## 2015-06-29 NOTE — Discharge Summary (Signed)
Discharge Summary   Patient ID: [name],  MRN: [id number], DOB/AGE: 1941/06/07 74 y.o.  Admit date: 06/28/2015 Discharge date: 06/29/2015  Primary Care Provider: Blair Heys R Primary Cardiologist: Dr. Allyson Sabal  Discharge Diagnoses Active Problems:   Critical lower limb ischemia   Allergies Allergies  Allergen Reactions  . Alendronate Anaphylaxis  . Dilaudid [Hydromorphone Hcl] Nausea And Vomiting and Other (See Comments)    Bradycardia  . Actonel [Risedronate] Nausea And Vomiting  . Boniva [Ibandronic Acid] Nausea And Vomiting  . Codeine Nausea And Vomiting  . Oxycodone Nausea And Vomiting  . Tramadol Nausea And Vomiting     Procedure  Pre Procedure Diagnosis: peripheral arterial disease  Post Procedure Diagnosis: peripheral arterial disease  Operators: Dr. Nanetta Batty  Procedures Performed: 1. Abdominal aortogram, bilateral iliac angiogram, bifemoral runoff 2. PTA/stent right external iliac artery 3. PTA/stent left external iliac artery/left common femoral artery  Estimated blood loss: Less than 50 mL  PROCEDURE DESCRIPTION:   The patient was brought to the second floor [facility] lab in the the postabsorptive state. She was premedicated with Valium 5 mg by mouth, IV Versed and fentanyl. Her right groin was prepped and shaved in usual sterile fashion. Xylocaine 1% was used for local anesthesia. A 5 French sheath was inserted into the right common femoral artery using standard Seldinger technique.a 5 French pigtail catheter was placed in the distal dominant aorta. Abdominal aortography, bilateral iliac angiography with bifemoral runoff was performed using bolus chase digital subtraction step table technique. Visipaque dye was used for the entirety of the case. Retrograde aortic pressure was monitored during the case.  Angiographic Data:   1: Abdominal aortogram-renal arteries widely  patent. The infrarenal abdominal aorta was free of significant atherosclerotic changes 2: Left lower extremity-there was a total occlusion of the entire left external iliac artery and proximal portion of the left common femoral artery. The left SFA was occluded at its origin. It reconstituted in the adductor canal by profunda femoris collaterals. There was three-vessel runoff. 3: Right lower extremity-there was an 80% focal proximal right external iliac artery stenosis, the right SFA was occluded and reconstituted and adductor canal by profunda femoris collaterals. There was three-vessel runoff.  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 The patient received 300 mg of Plavix by mouth. She will be hydrated overnight. She will be removed once the ACT falls below 170 a pressure will be held. She'll be discharged on the morning and will obtain lower extremity arterial Doppler studies in our [place] office next week after which she'll see me back in 2 weeks thereafter.   History of Present Illness  [name] is a delightful 74 year old thin-appearing widowed Caucasian female mother of 2 daughters, grandmother of 2 grandchildren referred by Dr. Al Corpus , podiatrist, ro Dr. Allyson Sabal for evaluation of critical limb ischemia. Her primary care physician is Dr. Manus Gunning. She has a history of 40-50 pack years of tobacco abuse currently smoking three quarters of a pack a day. She has no other cardiac risk factors. She's never had a heart attack or stroke and denies chest pain or shortness of breath. She has had a back injury in the past and just retired from working at [employer] in February of this year. She does complain of left lower extremity claudication. She manipulated her left heel approximately 6 weeks  ago which is developed into a  ischemic/nonhealing ulcer. She had little lower extremity artery Doppler studies performed in our office yesterday revealing a right ABI 0.48 with an occluded right SFA and high-grade right external iliac artery. Her left ABI was 0.24 with an occluded left external leg artery and SFA.    Hospital Course  Patient presented for outpatient angiography and potential intervention for critical limb ischemia and limb salvage salvage. 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 No complain overnight. Walked a bit with minimal claudication. Advice smoking cessation. She was discharged stable on ASA81mg  and plavix. Consider adding statin as outpatient. She will get started post-interventional study next week and then f/u with Dr. Allyson Sabal.   Discharge Vitals Blood pressure 123/58, pulse 73, temperature 98.2 F (36.8 C), temperature source Oral, resp. rate 16, height 4\' 11"  (1.499 m), weight 136 lb 3.9 oz (61.8 kg), SpO2 97 %.  Filed Weights   06/28/15 0730 06/29/15 0435  Weight: 133 lb (60.328 kg) 136 lb 3.9 oz (61.8 kg)    Labs  CBC  Recent Labs  06/29/15 0028  WBC 8.7  HGB 10.6*  HCT 32.9*  MCV 88.0  PLT 208   Basic Metabolic Panel  Recent Labs  06/29/15 0028  NA 139  K 3.6  CL 110  CO2 22  GLUCOSE 139*  BUN 22*  CREATININE 0.93  CALCIUM 8.4*   Disposition  Pt is being discharged home today in good condition.  Follow-up Plans & Appointments  Follow-up Information    Follow up with Nanetta Batty, MD On 07/21/2015.   Specialties:  Cardiology, Radiology   Why:  @2 :00    Contact information:   [address] [place] [address] [phone]       Follow up with [facility] On 07/14/2015.   Why:  @9 :00 for LE study   Contact information:   [address] [place]  [address] [phone]          Discharge Instructions    Call MD for:  redness, tenderness, or signs of infection (pain, swelling, redness, odor or green/yellow discharge around incision site)    Complete by:  As directed      Diet - low sodium heart healthy    Complete by:  As directed      Discharge instructions    Complete by:  As directed   Patients taking plavix  should generally stay away from medicines like ibuprofen, Advil, Motrin, naproxen, and Aleve due to risk of stomach bleeding. You may take Tylenol as directed or talk to your primary doctor about alternatives.     Increase activity slowly    Complete by:  As directed            F/u Labs/Studies: none   Discharge Medications    Medication List    STOP taking these medications        aspirin 500 MG tablet  Replaced by:  aspirin 81 MG EC tablet     naproxen sodium 220 MG tablet  Commonly known as:  ANAPROX      TAKE these medications        amoxicillin-clavulanate 875-125 MG per tablet  Commonly known as:  AUGMENTIN  Take 1 tablet by mouth 2 (two) times daily.     aspirin 81 MG EC tablet  Take 1 tablet (81 mg total) by mouth daily.  Notes to Patient:  NEW MEDICINE     Calcium Carbonate-Vitamin D 600-400 MG-UNIT per chew tablet  Chew 1 tablet by mouth 2 (two) times a week.     clopidogrel 75 MG tablet  Commonly known as:  PLAVIX  Take 1 tablet (75 mg total) by mouth daily with breakfast.  Notes to Patient:  NEW MEDICINE     mupirocin ointment 2 %  Commonly known as:  BACTROBAN  Apply to wound twice a day.     NEOSPORIN PLUS PAIN RELIEF MS EX  Apply 1 application topically as needed (for wound care).     ranitidine 150 MG tablet  Commonly known as:  ZANTAC  Take 150 mg by mouth daily as needed for heartburn.        Duration of Discharge Encounter   Greater than 30 minutes including physician time.  Signed, Shawnice Tilmon PA-C 06/29/2015, 1:28 PM

## 2015-06-29 NOTE — Progress Notes (Signed)
Patient Name: [name] Date of Encounter: 06/29/2015   SUBJECTIVE  Feels better. Denies chest pain, sob or palpitation.  CURRENT MEDS . aspirin EC  325 mg Oral Daily  .  ceFAZolin (ANCEF) IV  1 g Intravenous Once  . clopidogrel  75 mg Oral Q breakfast  . famotidine  20 mg Oral BID  . Influenza vac split quadrivalent PF  0.5 mL Intramuscular Tomorrow-1000  . mupirocin ointment   Topical BID  . neomycin-bacitracin-polymyxin   Topical 6 times per day  . pneumococcal 23 valent vaccine  0.5 mL Intramuscular Tomorrow-1000    OBJECTIVE  Filed Vitals:   06/28/15 2012 06/28/15 2300 06/29/15 0435 06/29/15 0902  BP: 100/33 124/26 108/39 123/58  Pulse:    73  Temp: 98.9 F (37.2 C) 98.5 F (36.9 C) 98.4 F (36.9 C) 98.2 F (36.8 C)  TempSrc: Oral Oral Oral Oral  Resp:  18 18 16   Height:      Weight:   136 lb 3.9 oz (61.8 kg)   SpO2:  98% 97% 97%    Intake/Output Summary (Last 24 hours) at 06/29/15 0929 Last data filed at 06/29/15 0900  Gross per 24 hour  Intake   1405 ml  Output   1250 ml  Net    155 ml   Filed Weights   06/28/15 0730 06/29/15 0435  Weight: 133 lb (60.328 kg) 136 lb 3.9 oz (61.8 kg)    PHYSICAL EXAM  General: Pleasant, NAD. Neuro: Alert and oriented X 3. Moves all extremities spontaneously. Psych: Normal affect. HEENT:  Normal  Neck: Supple without bruits or JVD. Lungs:  Resp regular and unlabored, CTA. Heart: RRR no s3, s4, or murmurs. Abdomen: Soft, non-tender, non-distended, BS + x 4.  Extremities: No clubbing, cyanosis or edema. DP 1+ and equal bilaterally. Right groin site without hematoma or bruit.   Accessory Clinical Findings  CBC  Recent Labs  06/29/15 0028  WBC 8.7  HGB 10.6*  HCT 32.9*  MCV 88.0  PLT 208   Basic Metabolic Panel  Recent Labs  06/29/15 0028  NA 139  K 3.6  CL 110  CO2 22  GLUCOSE 139*  BUN 22*  CREATININE 0.93  CALCIUM 8.4*    TELE  NSR with PVCs and ventricular bigeminy.    Radiology/Studies  Dg Chest 2 View  06/24/2015   CLINICAL DATA:  Preprocedure for angioplasty on 06/28/2015. History of tobacco use.  EXAM: CHEST  2 VIEW  COMPARISON:  Chest x-ray dated 11/10/2014.  FINDINGS: Heart size is within normal limits. 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 Coarse interstitial markings are again seen throughout both lungs suggesting chronic interstitial fibrosis. No confluent airspace opacity to suggest a developing pneumonia. No pleural effusion. No pneumothorax.  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 No acute-appearing osseous abnormality.  IMPRESSION: No evidence of acute cardiopulmonary abnormality. Evidence of chronic interstitial fibrosis.   Electronically Signed   By: Bary Richard M.D.   On: 06/24/2015 09:16   Pre Procedure Diagnosis: peripheral arterial disease  Post Procedure Diagnosis: peripheral arterial disease  Operators: Dr. Nanetta Batty  Procedures Performed: 1. Abdominal aortogram, bilateral iliac angiogram, bifemoral runoff 2. PTA/stent right external iliac artery 3. PTA/stent left external iliac artery/left common femoral artery  Estimated blood loss: Less than 50 mL  PROCEDURE DESCRIPTION:   The patient was brought to the second floor  Cardiac cath lab in the the  postabsorptive state. She was premedicated with Valium 5 mg by mouth, IV Versed and fentanyl. Her right groin was prepped and shaved in usual sterile fashion. Xylocaine 1% was used for local anesthesia. A 5 French sheath was inserted into the right common femoral artery using standard Seldinger technique.a 5 French pigtail catheter was placed in the distal dominant aorta. Abdominal aortography, bilateral iliac angiography with bifemoral runoff was performed using bolus chase digital subtraction step table technique. Visipaque dye was  used for the entirety of the case. Retrograde aortic pressure was monitored during the case.    Angiographic Data:   1: Abdominal aortogram-renal arteries widely patent. The infrarenal abdominal aorta was free of significant atherosclerotic changes 2: Left lower extremity-there was a total occlusion of the entire left external iliac artery and proximal portion of the left common femoral artery. The left SFA was occluded at its origin. It reconstituted in the adductor canal by profunda femoris collaterals. There was three-vessel runoff. 3: Right lower extremity-there was an 80% focal proximal right external iliac artery stenosis, the right SFA was occluded and reconstituted and adductor canal by profunda femoris collaterals. 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 The patient received 300 mg of Plavix by mouth. She will be hydrated overnight. She will be removed once the ACT falls below 170 a pressure will be held. She'll be discharged on the morning and will obtain lower extremity arterial Doppler studies in our [place] office next week after which she'll see me back in 2 weeks thereafter.   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  - Continue ASA 81 and Plavix.  - Discharge home later today.   Signed, Bhagat,Bhavinkumar PA-C Pager [phone]  I saw & examined the patient today along with Mr. Iver Nestle.  She looks & feels good post Peripheral PTA-Stenting to Bilateral Iliac arteries.  Walked a bit with minimal claudication. Weak, but palpable LLE pulses. Groin stable. Would  strongly consider adding statin. Smoking cessation given.  OK for d/c -- standard post Peripheral Procedure d/c plan to f/u with LEA Dopplers then ROV with PA vs. Dr. Allyson Sabal.   Marykay Lex, M.D., M.S. Interventional Cardiologist   Pager # [phone]

## 2015-06-29 NOTE — Progress Notes (Signed)
Patient having frequent PVC's, patient asymptomatic, Vital signs stable as charted, fellow on-call informed, no new orders received. Will continue to monitor patient.

## 2015-06-30 NOTE — Addendum Note (Signed)
Addended by: Neta Ehlers on: 06/30/2015 05:41 PM   Modules accepted: Orders

## 2015-07-01 ENCOUNTER — Ambulatory Visit: Payer: Medicare Other | Admitting: Podiatry

## 2015-07-07 ENCOUNTER — Telehealth: Payer: Self-pay | Admitting: *Deleted

## 2015-07-07 ENCOUNTER — Telehealth: Payer: Self-pay | Admitting: Cardiovascular Disease

## 2015-07-07 ENCOUNTER — Other Ambulatory Visit: Payer: Self-pay | Admitting: Physician Assistant

## 2015-07-07 DIAGNOSIS — I700 Atherosclerosis of aorta: Secondary | ICD-10-CM

## 2015-07-07 NOTE — Telephone Encounter (Signed)
Yes finish the next dose and follow up with [place] in the next couple of weeks.

## 2015-07-07 NOTE — Telephone Encounter (Signed)
Routing to Phillips Hay, PharmD, to address question.  Patient is s/p bilateral iliac stents 06/28/15.

## 2015-07-07 NOTE — Telephone Encounter (Signed)
Notified patient of notations from [name] below. Patient verbalized appreciation at the call and the "good news".

## 2015-07-07 NOTE — Telephone Encounter (Addendum)
Pt asked if she need to finish the 2nd round of the Amoxicillin/Clavulanate, and that Dr. Allyson Sabal had placed stents in her left leg and one in the groin of the right leg on the 06/28/2015, and pt states the left heel is finally healing up.  Pt was instructed to complete the 2nd round of the antibiotic.

## 2015-07-07 NOTE — Telephone Encounter (Signed)
Pt called in stating that Dr.Berry put some stents in her legs on 8/29 and she would like to know if she is allowed to eat salads. Please call  Thanks

## 2015-07-07 NOTE — Telephone Encounter (Signed)
Pt has no restrictions on green vegetables.  She is not on warfarin therefore not a concern.

## 2015-07-14 ENCOUNTER — Ambulatory Visit (HOSPITAL_COMMUNITY)
Admission: RE | Admit: 2015-07-14 | Discharge: 2015-07-14 | Disposition: A | Discharge status: Home / Self Care | Payer: Medicare Other | Source: Ambulatory Visit | Attending: Physician Assistant | Admitting: Physician Assistant

## 2015-07-14 ENCOUNTER — Other Ambulatory Visit: Payer: Self-pay | Admitting: Physician Assistant

## 2015-07-14 ENCOUNTER — Ambulatory Visit (HOSPITAL_BASED_OUTPATIENT_CLINIC_OR_DEPARTMENT_OTHER)
Admission: RE | Admit: 2015-07-14 | Discharge: 2015-07-14 | Disposition: A | Discharge status: Home / Self Care | Payer: Medicare Other | Source: Ambulatory Visit | Attending: Physician Assistant | Admitting: Physician Assistant

## 2015-07-14 DIAGNOSIS — I700 Atherosclerosis of aorta: Secondary | ICD-10-CM

## 2015-07-14 DIAGNOSIS — I739 Peripheral vascular disease, unspecified: Secondary | ICD-10-CM | POA: Diagnosis not present

## 2015-07-14 DIAGNOSIS — Z95828 Presence of other vascular implants and grafts: Secondary | ICD-10-CM

## 2015-07-14 DIAGNOSIS — Z9889 Other specified postprocedural states: Secondary | ICD-10-CM

## 2015-07-14 DIAGNOSIS — I70203 Unspecified atherosclerosis of native arteries of extremities, bilateral legs: Secondary | ICD-10-CM | POA: Diagnosis not present

## 2015-07-21 ENCOUNTER — Ambulatory Visit (INDEPENDENT_AMBULATORY_CARE_PROVIDER_SITE_OTHER): Payer: Medicare Other | Admitting: Cardiovascular Disease

## 2015-07-21 ENCOUNTER — Encounter: Payer: Self-pay | Admitting: Cardiovascular Disease

## 2015-07-21 VITALS — BP 136/80 | HR 80 | Ht 59.0 in | Wt 134.0 lb

## 2015-07-21 DIAGNOSIS — I998 Other disorder of circulatory system: Secondary | ICD-10-CM

## 2015-07-21 DIAGNOSIS — I739 Peripheral vascular disease, unspecified: Secondary | ICD-10-CM | POA: Diagnosis not present

## 2015-07-21 DIAGNOSIS — Z79899 Other long term (current) drug therapy: Secondary | ICD-10-CM | POA: Diagnosis not present

## 2015-07-21 DIAGNOSIS — I70229 Atherosclerosis of native arteries of extremities with rest pain, unspecified extremity: Secondary | ICD-10-CM

## 2015-07-21 NOTE — Progress Notes (Signed)
07/21/2015 MARAGRET VANACKER   1941-09-10  [id number]  Primary Physician Thora Lance, MD Primary Cardiologist: Runell Gess MD Roseanne Reno   HPI:  [name] is a delightful 74 year old thin-appearing widowed Caucasian female mother of 2 daughters, grandmother of 2 grandchildren referred by Dr. Al Corpus , podiatrist, for evaluation of critical limb ischemia. I last saw her in the office 06/23/15.Her primary care physician is Dr. Manus Gunning. She has a history of 40-50 pack years of tobacco abuse currently smoking three quarters of a pack a day. She has no other cardiac risk factors. She's never had a heart attack or stroke and denies chest pain or shortness of breath. She has had a back injury in the past and just retired from working at [employer] in February of this year. She does complain of left lower extremity claudication. She manipulated her left heel approximately 6 weeks ago which is developed into a ischemic/nonhealing ulcer. She had little lower extremity artery Doppler studies performed in our office yesterday revealing a right ABI 0.48 with an occluded right SFA and high-grade right external iliac artery. Her left ABI was 0.24 with an occluded left external leg artery and SFA. I performed peripheral angiography on her 06/28/15 revealing an occluded left external iliac artery and common femoral artery which I stented with residual total occlusion of her left SFA. She also had a right SFA occlusion and a high-grade right external iliac artery stenosis which I stented as well. Her ABIs improved as did her symptoms. She does continue to smoke but is cutting back. Her left heel wound is healing.  Current Outpatient Prescriptions  Medication Sig Dispense Refill  . aspirin EC 81 MG EC tablet Take 1 tablet (81 mg total) by mouth daily. 30 tablet   . Calcium Carbonate-Vitamin D 600-400 MG-UNIT per chew tablet Chew 1 tablet by mouth 2 (two) times a week.     . clopidogrel  (PLAVIX) 75 MG tablet Take 1 tablet (75 mg total) by mouth daily with breakfast. 30 tablet 6  . Neomycin-Polymyxin-Pramoxine (NEOSPORIN PLUS PAIN RELIEF MS EX) Apply 1 application topically as needed (for wound care).    . ranitidine (ZANTAC) 150 MG tablet Take 150 mg by mouth daily as needed for heartburn.      No current facility-administered medications for this visit.    Allergies  Allergen Reactions  . Alendronate Anaphylaxis  . Dilaudid [Hydromorphone Hcl] Nausea And Vomiting and Other (See Comments)    Bradycardia  . Actonel [Risedronate] Nausea And Vomiting  . Boniva [Ibandronic Acid] Nausea And Vomiting  . Codeine Nausea And Vomiting  . Oxycodone Nausea And Vomiting  . Tramadol Nausea And Vomiting    Social History   Social History  . Marital Status: Widowed    Spouse Name: N/A  . Number of Children: N/A  . Years of Education: N/A   Occupational History  . Not on file.   Social History Main Topics  . Smoking status: Current Every Day Smoker -- 0.75 packs/day for 53 years    Types: Cigarettes  . Smokeless tobacco: Never Used  . Alcohol Use: No  . Drug Use: No  . Sexual Activity: No   Other Topics Concern  . Not on file   Social History Narrative     Review of Systems: General: negative for chills, fever, night sweats or weight changes.  Cardiovascular: negative for chest pain, dyspnea on exertion, edema, orthopnea, palpitations, paroxysmal nocturnal dyspnea or shortness of breath Dermatological: negative  for rash Respiratory: negative for cough or wheezing Urologic: negative for hematuria Abdominal: negative for nausea, vomiting, diarrhea, bright red blood per rectum, melena, or hematemesis Neurologic: negative for visual changes, syncope, or dizziness All other systems reviewed and are otherwise negative except as noted above.    Blood pressure 136/80, pulse 80, height  (1.499 m), weight 134 lb (60.782 kg).  General appearance: alert and no  distress Neck: no adenopathy, no carotid bruit, no JVD, supple, symmetrical, trachea midline and thyroid not enlarged, symmetric, no tenderness/mass/nodules Lungs: clear to auscultation bilaterally Heart: regular rate and rhythm, S1, S2 normal, no murmur, click, rub or gallop Extremities: extremities normal, atraumatic, no cyanosis or edema and 2+ femoral pulses with upper  EKG not performed today  ASSESSMENT AND PLAN:   Critical lower limb ischemia [name] returns for follow-up after her recent peripheral vascular procedure performed 06/28/15 for critical limb ischemia. She had an occluded left external iliac and common femoral artery which I stented. I also stented her right iliac artery as well. She has total SFAs. Her symptoms markedly improved as did her Doppler studies. Her right ABI increased from 0.4 8-.71 and a left ABI increased from 0.24 -.65. She is on dual anti-platelet therapy. We will recheck Dopplers 6 months I will see her back after that. At this time I plan to treat her SFA occlusions medically      Runell Gess MD [facility], [facility] 07/21/2015 3:40 PM

## 2015-07-21 NOTE — Assessment & Plan Note (Signed)
Mrs. Klomp returns for follow-up after her recent peripheral vascular procedure performed 06/28/15 for critical limb ischemia. She had an occluded left external iliac and common femoral artery which I stented. I also stented her right iliac artery as well. She has total SFAs. Her symptoms markedly improved as did her Doppler studies. Her right ABI increased from 0.4 8-.71 and a left ABI increased from 0.24 -.65. She is on dual anti-platelet therapy. We will recheck Dopplers 6 months I will see her back after that. At this time I plan to treat her SFA occlusions medically

## 2015-07-21 NOTE — Patient Instructions (Signed)
Medication Instructions:  Your physician recommends that you continue on your current medications as directed. Please refer to the Current Medication list given to you today.   Labwork: Your physician recommends that you return for lab work in: FASTING (LIPIDS/LIVER) The lab can be found on the FIRST FLOOR of out building in [address]   Testing/Procedures: Your physician has requested that you have a lower extremity arterial doppler- During this test, ultrasound is used to evaluate arterial blood flow in the legs. Allow approximately one hour for this exam. IN 6 MONTHS  Follow-Up: Your physician wants you to follow-up in: 6 MONTHS WITH DR. Allyson Sabal - FEW WEEKS AFTER LE DOPPLERS ARE COMPLETED You will receive a reminder letter in the mail two months in advance. If you don't receive a letter, please call our office to schedule the follow-up appointment.   Any Other Special Instructions Will Be Listed Below (If Applicable).

## 2015-07-22 LAB — HEPATIC FUNCTION PANEL
ALBUMIN: 4.3 g/dL (ref 3.6–5.1)
ALT: 40 U/L — AB (ref 6–29)
AST: 44 U/L — ABNORMAL HIGH (ref 10–35)
Alkaline Phosphatase: 136 U/L — ABNORMAL HIGH (ref 33–130)
Bilirubin, Direct: 0.2 mg/dL (ref <=0.2)
Indirect Bilirubin: 0.6 mg/dL (ref 0.2–1.2)
TOTAL PROTEIN: 7 g/dL (ref 6.1–8.1)
Total Bilirubin: 0.8 mg/dL (ref 0.2–1.2)

## 2015-07-22 LAB — LIPID PANEL
CHOLESTEROL: 217 mg/dL — AB (ref 125–200)
HDL: 77 mg/dL (ref >=46)
LDL Cholesterol: 125 mg/dL (ref <130)
TRIGLYCERIDES: 76 mg/dL (ref <150)
Total CHOL/HDL Ratio: 2.8 Ratio (ref <=5.0)
VLDL: 15 mg/dL (ref <30)

## 2015-07-30 ENCOUNTER — Telehealth: Payer: Self-pay | Admitting: Cardiovascular Disease

## 2015-07-30 NOTE — Telephone Encounter (Signed)
Wanting to know results lab test

## 2015-07-30 NOTE — Telephone Encounter (Signed)
Called patient, she was informed of results, voiced understanding.

## 2015-08-03 ENCOUNTER — Ambulatory Visit (INDEPENDENT_AMBULATORY_CARE_PROVIDER_SITE_OTHER): Payer: Medicare Other | Admitting: Podiatry

## 2015-08-03 ENCOUNTER — Encounter: Payer: Self-pay | Admitting: Podiatry

## 2015-08-03 VITALS — BP 136/59 | HR 53 | Resp 16

## 2015-08-03 DIAGNOSIS — B351 Tinea unguium: Secondary | ICD-10-CM

## 2015-08-03 DIAGNOSIS — M79676 Pain in unspecified toe(s): Secondary | ICD-10-CM | POA: Diagnosis not present

## 2015-08-03 DIAGNOSIS — L97421 Non-pressure chronic ulcer of left heel and midfoot limited to breakdown of skin: Secondary | ICD-10-CM

## 2015-08-03 NOTE — Progress Notes (Signed)
She presents today for follow-up of ulceration posterior aspect of her left heel and severe peripheral vascular disease. After she saw a vascular doctor he placed stents in her SFA bilateral. She states that now everything is great and she can even walk to her car. She still has elongated painful toenails that the vascular surgeon suggested that she probably have cut by [place].  Objective: Vital signs are stable she's alert and oriented 3 pulses are strongly palpable bilaterally. Neurologic sensorium is intact. Deep tendon reflexes are intact. Toenails are long and dystrophic onychomycotic and painful on palpation as well as debridement. All ulcerations have gone on to heal uneventfully.  Assessment: Pain in limb secondary to onychomycosis. Peripheral vascular disease with SFA stents.  Plan: Debridement of nails 1 through 5 bilateral.

## 2015-08-19 ENCOUNTER — Other Ambulatory Visit: Payer: Self-pay | Admitting: Family Medicine

## 2015-08-19 DIAGNOSIS — R9389 Abnormal findings on diagnostic imaging of other specified body structures: Secondary | ICD-10-CM

## 2015-08-26 ENCOUNTER — Ambulatory Visit
Admission: RE | Admit: 2015-08-26 | Discharge: 2015-08-26 | Disposition: A | Discharge status: Home / Self Care | Payer: Medicare Other | Source: Ambulatory Visit | Attending: Family Medicine | Admitting: Family Medicine

## 2015-08-26 DIAGNOSIS — R9389 Abnormal findings on diagnostic imaging of other specified body structures: Secondary | ICD-10-CM

## 2015-11-04 ENCOUNTER — Ambulatory Visit: Payer: Medicare Other | Admitting: Podiatry

## 2016-05-12 LAB — GLUCOSE, POCT (MANUAL RESULT ENTRY): POC GLUCOSE: 104 mg/dL — AB (ref 70–99)

## 2016-07-17 ENCOUNTER — Other Ambulatory Visit: Payer: Self-pay | Admitting: Cardiovascular Disease

## 2016-07-17 DIAGNOSIS — I739 Peripheral vascular disease, unspecified: Secondary | ICD-10-CM

## 2016-07-25 ENCOUNTER — Inpatient Hospital Stay (HOSPITAL_COMMUNITY): Admission: RE | Admit: 2016-07-25 | Payer: Medicare Other | Source: Ambulatory Visit

## 2016-08-31 ENCOUNTER — Other Ambulatory Visit: Payer: Self-pay | Admitting: Family Medicine

## 2016-08-31 DIAGNOSIS — R911 Solitary pulmonary nodule: Secondary | ICD-10-CM

## 2016-10-10 ENCOUNTER — Other Ambulatory Visit: Payer: Medicare Other

## 2017-05-03 ENCOUNTER — Other Ambulatory Visit: Payer: Medicare Other

## 2020-09-30 ENCOUNTER — Ambulatory Visit (INDEPENDENT_AMBULATORY_CARE_PROVIDER_SITE_OTHER): Payer: Medicare Other

## 2020-09-30 ENCOUNTER — Other Ambulatory Visit: Payer: Self-pay | Admitting: Podiatry

## 2020-09-30 ENCOUNTER — Encounter: Payer: Self-pay | Admitting: Podiatry

## 2020-09-30 ENCOUNTER — Other Ambulatory Visit: Payer: Self-pay

## 2020-09-30 ENCOUNTER — Ambulatory Visit: Payer: Medicare Other | Admitting: Podiatry

## 2020-09-30 DIAGNOSIS — S9032XA Contusion of left foot, initial encounter: Secondary | ICD-10-CM | POA: Diagnosis not present

## 2020-09-30 DIAGNOSIS — M7672 Peroneal tendinitis, left leg: Secondary | ICD-10-CM | POA: Diagnosis not present

## 2020-09-30 DIAGNOSIS — M778 Other enthesopathies, not elsewhere classified: Secondary | ICD-10-CM

## 2020-09-30 DIAGNOSIS — S93402A Sprain of unspecified ligament of left ankle, initial encounter: Secondary | ICD-10-CM | POA: Diagnosis not present
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
## 2020-11-01 DIAGNOSIS — L821 Other seborrheic keratosis: Secondary | ICD-10-CM | POA: Diagnosis not present

## 2020-11-01 DIAGNOSIS — D0472 Carcinoma in situ of skin of left lower limb, including hip: Secondary | ICD-10-CM | POA: Diagnosis not present

## 2020-11-09 DIAGNOSIS — D0472 Carcinoma in situ of skin of left lower limb, including hip: Secondary | ICD-10-CM | POA: Diagnosis not present

## 2020-12-30 DIAGNOSIS — H3540 Unspecified peripheral retinal degeneration: Secondary | ICD-10-CM | POA: Diagnosis not present

## 2021-01-13 DIAGNOSIS — I700 Atherosclerosis of aorta: Secondary | ICD-10-CM | POA: Diagnosis not present

## 2021-01-13 DIAGNOSIS — Z136 Encounter for screening for cardiovascular disorders: Secondary | ICD-10-CM | POA: Diagnosis not present

## 2021-01-13 DIAGNOSIS — Z131 Encounter for screening for diabetes mellitus: Secondary | ICD-10-CM | POA: Diagnosis not present

## 2021-01-13 DIAGNOSIS — Z0000 Encounter for general adult medical examination without abnormal findings: Secondary | ICD-10-CM | POA: Diagnosis not present

## 2021-05-10 DIAGNOSIS — D692 Other nonthrombocytopenic purpura: Secondary | ICD-10-CM | POA: Diagnosis not present

## 2021-05-10 DIAGNOSIS — L821 Other seborrheic keratosis: Secondary | ICD-10-CM | POA: Diagnosis not present

## 2021-05-10 DIAGNOSIS — L565 Disseminated superficial actinic porokeratosis (DSAP): Secondary | ICD-10-CM | POA: Diagnosis not present

## 2021-05-10 DIAGNOSIS — L814 Other melanin hyperpigmentation: Secondary | ICD-10-CM | POA: Diagnosis not present

## 2021-05-10 DIAGNOSIS — Z85828 Personal history of other malignant neoplasm of skin: Secondary | ICD-10-CM | POA: Diagnosis not present

## 2021-12-30 DIAGNOSIS — H3540 Unspecified peripheral retinal degeneration: Secondary | ICD-10-CM | POA: Diagnosis not present

## 2021-12-30 DIAGNOSIS — H40053 Ocular hypertension, bilateral: Secondary | ICD-10-CM | POA: Diagnosis not present

## 2022-01-17 DIAGNOSIS — M545 Low back pain, unspecified: Secondary | ICD-10-CM | POA: Diagnosis not present

## 2022-02-13 DIAGNOSIS — M545 Low back pain, unspecified: Secondary | ICD-10-CM | POA: Diagnosis not present

## 2022-03-23 ENCOUNTER — Emergency Department (HOSPITAL_COMMUNITY)
Admission: EM | Admit: 2022-03-23 | Discharge: 2022-03-23 | Disposition: A | Discharge status: Home / Self Care | Payer: Medicare Other | Attending: Emergency Medicine | Admitting: Emergency Medicine

## 2022-03-23 ENCOUNTER — Other Ambulatory Visit: Payer: Self-pay

## 2022-03-23 ENCOUNTER — Emergency Department (HOSPITAL_COMMUNITY): Payer: Medicare Other

## 2022-03-23 DIAGNOSIS — Z7982 Long term (current) use of aspirin: Secondary | ICD-10-CM | POA: Diagnosis not present

## 2022-03-23 DIAGNOSIS — S3992XA Unspecified injury of lower back, initial encounter: Secondary | ICD-10-CM | POA: Diagnosis present

## 2022-03-23 DIAGNOSIS — M545 Low back pain, unspecified: Secondary | ICD-10-CM | POA: Diagnosis not present

## 2022-03-23 DIAGNOSIS — S32038A Other fracture of third lumbar vertebra, initial encounter for closed fracture: Secondary | ICD-10-CM | POA: Diagnosis not present

## 2022-03-23 DIAGNOSIS — S32030A Wedge compression fracture of third lumbar vertebra, initial encounter for closed fracture: Secondary | ICD-10-CM

## 2022-03-23 DIAGNOSIS — X58XXXA Exposure to other specified factors, initial encounter: Secondary | ICD-10-CM | POA: Insufficient documentation

## 2022-03-23 DIAGNOSIS — M549 Dorsalgia, unspecified: Secondary | ICD-10-CM | POA: Diagnosis not present

## 2022-03-23 DIAGNOSIS — Z743 Need for continuous supervision: Secondary | ICD-10-CM | POA: Diagnosis not present

## 2022-03-23 DIAGNOSIS — R6889 Other general symptoms and signs: Secondary | ICD-10-CM | POA: Diagnosis not present

## 2022-03-23 DIAGNOSIS — S32048A Other fracture of fourth lumbar vertebra, initial encounter for closed fracture: Secondary | ICD-10-CM | POA: Insufficient documentation

## 2022-03-23 LAB — BASIC METABOLIC PANEL
Anion gap: 10 (ref 5–15)
BUN: 10 mg/dL (ref 8–23)
CO2: 22 mmol/L (ref 22–32)
Calcium: 9.3 mg/dL (ref 8.9–10.3)
Chloride: 110 mmol/L (ref 98–111)
Creatinine, Ser: 0.81 mg/dL (ref 0.44–1.00)
GFR, Estimated: >60 mL/min (ref >60)
Glucose, Bld: 98 mg/dL (ref 70–99)
Potassium: 3.5 mmol/L (ref 3.5–5.1)
Sodium: 142 mmol/L (ref 135–145)

## 2022-03-23 LAB — CBC WITH DIFFERENTIAL/PLATELET
Abs Immature Granulocytes: 0.02 10*3/uL (ref 0.00–0.07)
Basophils Absolute: 0.1 10*3/uL (ref 0.0–0.1)
Basophils Relative: 1 %
Eosinophils Absolute: 0.1 10*3/uL (ref 0.0–0.5)
Eosinophils Relative: 1 %
HCT: 38.3 % (ref 36.0–46.0)
Hemoglobin: 12.5 g/dL (ref 12.0–15.0)
Immature Granulocytes: 0 %
Lymphocytes Relative: 20 %
Lymphs Abs: 1.5 10*3/uL (ref 0.7–4.0)
MCH: 29.7 pg (ref 26.0–34.0)
MCHC: 32.6 g/dL (ref 30.0–36.0)
MCV: 91 fL (ref 80.0–100.0)
Monocytes Absolute: 0.4 10*3/uL (ref 0.1–1.0)
Monocytes Relative: 5 %
Neutro Abs: 5.3 10*3/uL (ref 1.7–7.7)
Neutrophils Relative %: 73 %
Platelets: 229 10*3/uL (ref 150–400)
RBC: 4.21 MIL/uL (ref 3.87–5.11)
RDW: 13 % (ref 11.5–15.5)
WBC: 7.4 10*3/uL (ref 4.0–10.5)
nRBC: 0 % (ref 0.0–0.2)

## 2022-03-23 LAB — I-STAT VENOUS BLOOD GAS, ED
Acid-Base Excess: 1 mmol/L (ref 0.0–2.0)
Bicarbonate: 25.2 mmol/L (ref 20.0–28.0)
Calcium, Ion: 1.18 mmol/L (ref 1.15–1.40)
HCT: 36 % (ref 36.0–46.0)
Hemoglobin: 12.2 g/dL (ref 12.0–15.0)
O2 Saturation: 100 %
Potassium: 3.5 mmol/L (ref 3.5–5.1)
Sodium: 141 mmol/L (ref 135–145)
TCO2: 26 mmol/L (ref 22–32)
pCO2, Ven: 36.1 mmHg — ABNORMAL LOW (ref 44–60)
pH, Ven: 7.452 — ABNORMAL HIGH (ref 7.25–7.43)
pO2, Ven: 170 mmHg — ABNORMAL HIGH (ref 32–45)

## 2022-03-23 MED ORDER — KETOROLAC TROMETHAMINE 15 MG/ML IJ SOLN
15.0000 mg | Freq: Once | INTRAMUSCULAR | Status: AC
  Administered 2022-03-23: 15 mg via INTRAVENOUS
  Filled 2022-03-23: qty 1

## 2022-03-23 MED ORDER — ACETAMINOPHEN 500 MG PO TABS: 1000.0000 mg | ORAL_TABLET | Freq: Once | ORAL | Status: DC

## 2022-03-23 MED ORDER — DICLOFENAC SODIUM 1 % EX GEL
2.0000 g | Freq: Once | CUTANEOUS | Status: AC
  Administered 2022-03-23: 2 g via TOPICAL
  Filled 2022-03-23: qty 100
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
## 2022-03-23 NOTE — Discharge Instructions (Signed)
If you develop worsening, recurrent, or continued back pain, numbness or weakness in the legs, incontinence of your bowels or bladders, numbness of your buttocks, fever, abdominal pain, or any other new/concerning symptoms then return to the ER for evaluation.  

## 2022-03-23 NOTE — ED Provider Notes (Signed)
[facility] EMERGENCY DEPARTMENT Provider Note   CSN: [id number] Arrival date & time: 03/23/22  1334     History  Chief Complaint  Patient presents with   Back Pain    [name] is a 81 y.o. female.  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  Patient reports the last known fracture she had was January 17, 2022.  She saw Dr. Turner Daniels her orthopedist and a brace was ordered.  She reports she received a large brace and it never fit well and made her back hurt worse so she just did not use it.  She has been taking aspirin, intermittent ibuprofen for the pain and was able to move around but in the last 2 days the pain has gotten worse.  It is in her lower back and radiates over to her right hip and upper leg.  When she tries to wear the brace it makes the pain worse.  She has taken aspirin yesterday for the pain but today reports she could not even get up and walk because she was in so much pain.  She denies any recent falls, pops in her back or sitting down hard.  She denies any dysuria frequency or urgency and patient has had no bowel incontinence.  The history is provided by the patient and a relative.  Back Pain     Home Medications Prior to Admission medications   Medication Sig Start Date End Date Taking? Authorizing Provider  aspirin 325 MG tablet Take 325 mg by mouth daily.    [provider]  Multiple Vitamins-Minerals (CENTRUM SILVER ADULT 50+ PO) Take by mouth.    [provider]      Allergies    Alendronate, Dilaudid [hydromorphone hcl], Actonel [risedronate], Boniva [ibandronic acid], Codeine, Oxycodone, and Tramadol    Review of Systems   Review of Systems  Musculoskeletal:  Positive for back pain.   Physical Exam Updated Vital Signs BP (!)  118/57   Pulse 67   Temp 98.4 F (36.9 C) (Oral)   Resp 19   Ht 4\' 9"  (1.448 m)   Wt 55.3 kg   SpO2 100%   BMI 26.40 kg/m  Physical Exam Vitals and nursing note reviewed.  Constitutional:      General: She is not in acute distress.    Appearance: She is well-developed.  HENT:     Head: Normocephalic and atraumatic.  Eyes:     Pupils: Pupils are equal, round, and reactive to light.  Cardiovascular:     Rate and Rhythm: Normal rate and regular rhythm.     Heart sounds: Normal heart sounds. No murmur heard.   No friction rub.  Pulmonary:     Effort: Pulmonary effort is normal.     Breath sounds: Normal breath sounds. No wheezing or rales.  Abdominal:     General: Bowel sounds are normal. There is no distension.     Palpations: Abdomen is soft.     Tenderness: There is no abdominal tenderness. There is no guarding or rebound.  Musculoskeletal:        General: Tenderness present. Normal range of motion.     Lumbar back: Bony tenderness present.       Back:     Comments: No edema.  Significant pain with palpation in the center  of the low back in the lumbar region.  Minimal paralumbar tenderness in the musculature.  DP pulses intact with warm feet  Skin:    General: Skin is warm and dry.     Findings: No rash.  Neurological:     Mental Status: She is alert and oriented to person, place, and time.     Cranial Nerves: No cranial nerve deficit.     Comments: 5/5 strength bilaterally but positive straight leg raise on the left  Psychiatric:        Mood and Affect: Mood normal.        Behavior: Behavior normal.    ED Results / Procedures / Treatments   Labs (all labs ordered are listed, but only abnormal results are displayed) Labs Reviewed  BASIC METABOLIC PANEL  CBC WITH DIFFERENTIAL/PLATELET    EKG None  Radiology No results found.  Procedures Procedures    Medications Ordered in ED Medications  acetaminophen (TYLENOL) tablet 1,000 mg (has no administration  in time range)    ED Course/ Medical Decision Making/ A&P                           Medical Decision Making Amount and/or Complexity of Data Reviewed Labs: ordered. Radiology: ordered.  Risk OTC drugs.   Elderly female with multiple medical problems presenting today with midline back pain that radiates into her right hip.  Patient has history of recurrent vertebral fractures most recently was in March of this year.  She had a brace she was supposed to wear but reports he did not fit well and made her pain worse.  In the last 2 days the pain has returned but unclear why.  Concern for recurrent vertebral fracture causing worsening pain versus possible sciatica.  Patient is neurovascularly intact at this time.  She is having no abdominal symptoms or urinary symptoms and low suspicion for cauda equina, UTI or acute abdominal process.  Patient has intolerance to opiates but will give oral Tylenol.  CT of the lumbar spine is pending.  We will check labs and patient may also be a candidate for Toradol.        Final Clinical Impression(s) / ED Diagnoses Final diagnoses:  None    Rx / DC Orders ED Discharge Orders     None         Gwyneth Sprout, MD 03/29/22 1726

## 2022-03-23 NOTE — Progress Notes (Signed)
Orthopedic Tech Progress Note Patient Details:  [name] 1941/08/08 [id number]  Pt already has LSO back brace which her daughter brought with her. Pt did not want another brace given that it is the same device. I helped her adjust her own LSO to fit better and that seemed to provide some relief. She was able to get in/OOB, sit in/get out of a lower chair, and ambulate steadily without significant pain or assistance.  Ortho Devices Type of Ortho Device: Lumbar corsett Ortho Device/Splint Interventions: Ordered   Post Interventions Patient Tolerated: Refused intervention  Docia Furl 03/23/2022, 7:20 PM

## 2022-03-23 NOTE — ED Provider Notes (Signed)
4:07 PM Care transferred to me. Waiting on CT results and likely new back brace  7:22 PM Patient has been given a new brace. CT images viewed by myself, has compression fractures.  Unclear if these are new or worse than before, most recent comparison is from 2015.  Either way, no signs of an acute spinal cord emergency.  Appears stable for discharge home to follow-up with her spine specialist.  She has been able to ambulate and feels much better after being given IV Toradol.   Pricilla Loveless, MD 03/23/22 [phone]

## 2022-04-06 ENCOUNTER — Other Ambulatory Visit: Payer: Self-pay | Admitting: Orthopedic Surgery

## 2022-04-06 DIAGNOSIS — M5451 Vertebrogenic low back pain: Secondary | ICD-10-CM | POA: Diagnosis not present

## 2022-04-06 DIAGNOSIS — M546 Pain in thoracic spine: Secondary | ICD-10-CM | POA: Diagnosis not present

## 2022-04-06 DIAGNOSIS — M545 Low back pain, unspecified: Secondary | ICD-10-CM

## 2022-04-07 ENCOUNTER — Other Ambulatory Visit: Payer: Self-pay | Admitting: Physician Assistant

## 2022-04-07 DIAGNOSIS — J449 Chronic obstructive pulmonary disease, unspecified: Secondary | ICD-10-CM | POA: Diagnosis not present

## 2022-04-07 DIAGNOSIS — I739 Peripheral vascular disease, unspecified: Secondary | ICD-10-CM | POA: Diagnosis not present

## 2022-04-07 DIAGNOSIS — F17200 Nicotine dependence, unspecified, uncomplicated: Secondary | ICD-10-CM | POA: Diagnosis not present

## 2022-04-07 DIAGNOSIS — M810 Age-related osteoporosis without current pathological fracture: Secondary | ICD-10-CM | POA: Diagnosis not present

## 2022-04-07 DIAGNOSIS — R609 Edema, unspecified: Secondary | ICD-10-CM | POA: Diagnosis not present

## 2022-04-07 DIAGNOSIS — I700 Atherosclerosis of aorta: Secondary | ICD-10-CM | POA: Diagnosis not present

## 2022-04-07 DIAGNOSIS — L819 Disorder of pigmentation, unspecified: Secondary | ICD-10-CM | POA: Diagnosis not present

## 2022-04-08 ENCOUNTER — Ambulatory Visit
Admission: RE | Admit: 2022-04-08 | Discharge: 2022-04-08 | Disposition: A | Discharge status: Home / Self Care | Payer: Medicare Other | Source: Ambulatory Visit | Attending: Orthopedic Surgery | Admitting: Orthopedic Surgery

## 2022-04-08 DIAGNOSIS — M545 Low back pain, unspecified: Secondary | ICD-10-CM | POA: Diagnosis not present

## 2022-04-08 DIAGNOSIS — M48061 Spinal stenosis, lumbar region without neurogenic claudication: Secondary | ICD-10-CM | POA: Diagnosis not present

## 2022-04-08 DIAGNOSIS — M549 Dorsalgia, unspecified: Secondary | ICD-10-CM | POA: Diagnosis not present

## 2022-04-08 DIAGNOSIS — S32040A Wedge compression fracture of fourth lumbar vertebra, initial encounter for closed fracture: Secondary | ICD-10-CM | POA: Diagnosis not present

## 2022-04-08 DIAGNOSIS — M546 Pain in thoracic spine: Secondary | ICD-10-CM

## 2022-04-11 ENCOUNTER — Ambulatory Visit
Admission: RE | Admit: 2022-04-11 | Discharge: 2022-04-11 | Disposition: A | Discharge status: Home / Self Care | Payer: Medicare Other | Source: Ambulatory Visit | Attending: Physician Assistant | Admitting: Physician Assistant

## 2022-04-11 DIAGNOSIS — I739 Peripheral vascular disease, unspecified: Secondary | ICD-10-CM

## 2022-04-14 ENCOUNTER — Other Ambulatory Visit: Payer: Self-pay | Admitting: Orthopedic Surgery

## 2022-04-14 DIAGNOSIS — M5451 Vertebrogenic low back pain: Secondary | ICD-10-CM | POA: Diagnosis not present

## 2022-04-19 NOTE — Pre-Procedure Instructions (Signed)
Surgical Instructions    Your procedure is scheduled on April 27, 2022.  Report to [facility] Main Entrance "A" at 12:20 P.M., then check in with the Admitting office.  Call this number if you have problems the morning of surgery:  [phone]   If you have any questions prior to your surgery date call [phone]: Open Monday-Friday 8am-4pm    Remember:  Do not eat after midnight the night before your surgery  You may drink clear liquids until 12:20 PM the afternoon of your surgery.   Clear liquids allowed are: Water, Non-Citrus Juices (without pulp), Carbonated Beverages, Clear Tea, Black Coffee Only (NO MILK, CREAM OR POWDERED CREAMER of any kind), and Gatorade.  Patient Instructions  The night before surgery:  No food after midnight. ONLY clear liquids after midnight  The day of surgery (if you do NOT have diabetes):  Drink ONE (1) Pre-Surgery Clear Ensure by 12:20 PM the afternoon of surgery. Drink in one sitting. Do not sip.  This drink was given to you during your hospital  pre-op appointment visit.  Nothing else to drink after completing the  Pre-Surgery Clear Ensure.         If you have questions, please contact your surgeon's office.     Take these medicines the morning of surgery with A SIP OF WATER:  NONE  As of today, STOP taking any Aspirin (unless otherwise instructed by your surgeon) Aleve, Naproxen, Ibuprofen, Motrin, Advil, Goody's, BC's, all herbal medications, fish oil, and all vitamins.                     Do NOT Smoke (Tobacco/Vaping) for 24 hours prior to your procedure.  If you use a CPAP at night, you may bring your mask/headgear for your overnight stay.   Contacts, glasses, piercing's, hearing aid's, dentures or partials may not be worn into surgery, please bring cases for these belongings.    For patients admitted to the hospital, discharge time will be determined by your treatment team.   Patients discharged the day of surgery will not be  allowed to drive home, and someone needs to stay with them for 24 hours.  SURGICAL WAITING ROOM VISITATION Patients having surgery or a procedure may have two support people in the waiting room. These visitors may be switched out with other visitors if needed. Children under the age of 57 must have an adult accompany them who is not the patient. If the patient needs to stay at the hospital during part of their recovery, the visitor guidelines for inpatient rooms apply.  Please refer to the [facility] website for the visitor guidelines for Inpatients (after your surgery is over and you are in a regular room).    Special instructions:   [place]- Preparing For Surgery  Before surgery, you can play an important role. Because skin is not sterile, your skin needs to be as free of germs as possible. You can reduce the number of germs on your skin by washing with CHG (chlorahexidine gluconate) Soap before surgery.  CHG is an antiseptic cleaner which kills germs and bonds with the skin to continue killing germs even after washing.    Oral Hygiene is also important to reduce your risk of infection.  Remember - BRUSH YOUR TEETH THE MORNING OF SURGERY WITH YOUR REGULAR TOOTHPASTE  Please do not use if you have an allergy to CHG or antibacterial soaps. If your skin becomes reddened/irritated stop using the CHG.  Do not  shave (including legs and underarms) for at least 48 hours prior to first CHG shower. It is OK to shave your face.  Please follow these instructions carefully.   Shower the NIGHT BEFORE SURGERY and the MORNING OF SURGERY  If you chose to wash your hair, wash your hair first as usual with your normal shampoo.  After you shampoo, rinse your hair and body thoroughly to remove the shampoo.  Use CHG Soap as you would any other liquid soap. You can apply CHG directly to the skin and wash gently with a scrungie or a clean washcloth.   Apply the CHG Soap to your body ONLY FROM THE NECK  DOWN.  Do not use on open wounds or open sores. Avoid contact with your eyes, ears, mouth and genitals (private parts). Wash Face and genitals (private parts)  with your normal soap.   Wash thoroughly, paying special attention to the area where your surgery will be performed.  Thoroughly rinse your body with warm water from the neck down.  DO NOT shower/wash with your normal soap after using and rinsing off the CHG Soap.  Pat yourself dry with a CLEAN TOWEL.  Wear CLEAN PAJAMAS to bed the night before surgery  Place CLEAN SHEETS on your bed the night before your surgery  DO NOT SLEEP WITH PETS.   Day of Surgery: Take a shower with CHG soap.  Do not wear jewelry or makeup Do not wear lotions, powders, perfumes/colognes, or deodorant. Do not shave 48 hours prior to surgery.   Do not bring valuables to the hospital.  [facility] is not responsible for any belongings or valuables. Do not wear nail polish, gel polish, artificial nails, or any other type of covering on natural nails (fingers and toes) If you have artificial nails or gel coating that need to be removed by a nail salon, please have this removed prior to surgery. Artificial nails or gel coating may interfere with anesthesia's ability to adequately monitor your vital signs.  Wear Clean/Comfortable clothing the morning of surgery  Remember to brush your teeth WITH YOUR REGULAR TOOTHPASTE.   Please read over the following fact sheets that you were given.    If you received a COVID test during your pre-op visit  it is requested that you wear a mask when out in public, stay away from anyone that may not be feeling well and notify your surgeon if you develop symptoms. If you have been in contact with anyone that has tested positive in the last 10 days please notify you surgeon.

## 2022-04-20 ENCOUNTER — Encounter (HOSPITAL_COMMUNITY): Payer: Self-pay

## 2022-04-20 ENCOUNTER — Encounter (HOSPITAL_COMMUNITY)
Admission: RE | Admit: 2022-04-20 | Discharge: 2022-04-20 | Disposition: A | Discharge status: Home / Self Care | Payer: Medicare Other | Source: Ambulatory Visit | Attending: Orthopedic Surgery | Admitting: Orthopedic Surgery

## 2022-04-20 ENCOUNTER — Other Ambulatory Visit: Payer: Self-pay

## 2022-04-20 VITALS — BP 144/62 | HR 68 | Temp 98.0°F | Resp 17 | Ht <= 58 in | Wt 120.2 lb

## 2022-04-20 DIAGNOSIS — I739 Peripheral vascular disease, unspecified: Secondary | ICD-10-CM

## 2022-04-20 DIAGNOSIS — Z01812 Encounter for preprocedural laboratory examination: Secondary | ICD-10-CM | POA: Diagnosis not present

## 2022-04-20 DIAGNOSIS — Z01818 Encounter for other preprocedural examination: Secondary | ICD-10-CM

## 2022-04-20 DIAGNOSIS — J449 Chronic obstructive pulmonary disease, unspecified: Secondary | ICD-10-CM | POA: Diagnosis not present

## 2022-04-20 LAB — CBC
HCT: 38.4 % (ref 36.0–46.0)
Hemoglobin: 12.7 g/dL (ref 12.0–15.0)
MCH: 30 pg (ref 26.0–34.0)
MCHC: 33.1 g/dL (ref 30.0–36.0)
MCV: 90.8 fL (ref 80.0–100.0)
Platelets: 215 10*3/uL (ref 150–400)
RBC: 4.23 MIL/uL (ref 3.87–5.11)
RDW: 13.1 % (ref 11.5–15.5)
WBC: 6.9 10*3/uL (ref 4.0–10.5)
nRBC: 0 % (ref 0.0–0.2)

## 2022-04-20 LAB — BASIC METABOLIC PANEL
Anion gap: 9 (ref 5–15)
BUN: 16 mg/dL (ref 8–23)
CO2: 23 mmol/L (ref 22–32)
Calcium: 9.3 mg/dL (ref 8.9–10.3)
Chloride: 108 mmol/L (ref 98–111)
Creatinine, Ser: 0.77 mg/dL (ref 0.44–1.00)
GFR, Estimated: >60 mL/min (ref >60)
Glucose, Bld: 101 mg/dL — ABNORMAL HIGH (ref 70–99)
Potassium: 3.8 mmol/L (ref 3.5–5.1)
Sodium: 140 mmol/L (ref 135–145)

## 2022-04-20 LAB — SURGICAL PCR SCREEN
MRSA, PCR: NEGATIVE
Staphylococcus aureus: NEGATIVE

## 2022-04-20 NOTE — Progress Notes (Signed)
PCP - Dr. Blair Heys Cardiologist - denies  PPM/ICD - denies   Chest x-ray - 06/24/15 EKG - 06/23/15 Stress Test - denies ECHO - 10/16/10 Cardiac Cath - denies  Sleep Study - denies   DM- denies  Blood Thinner Instructions: n/a Aspirin Instructions: hold starting today  ERAS Protcol - yes PRE-SURGERY Ensure given at PAT  COVID TEST- n/a   Anesthesia review: no  Patient denies shortness of breath, fever, cough and chest pain at PAT appointment   All instructions explained to the patient, with a verbal understanding of the material. Patient agrees to go over the instructions while at home for a better understanding. The opportunity to ask questions was provided.

## 2022-04-27 ENCOUNTER — Other Ambulatory Visit: Payer: Self-pay

## 2022-04-27 ENCOUNTER — Encounter (HOSPITAL_COMMUNITY)
Admission: RE | Disposition: A | Discharge status: Home / Self Care | Payer: Self-pay | Source: Home / Self Care | Attending: Orthopedic Surgery

## 2022-04-27 ENCOUNTER — Ambulatory Visit (HOSPITAL_COMMUNITY): Payer: Medicare Other | Admitting: Certified Registered"

## 2022-04-27 ENCOUNTER — Ambulatory Visit (HOSPITAL_BASED_OUTPATIENT_CLINIC_OR_DEPARTMENT_OTHER): Payer: Medicare Other | Admitting: Certified Registered"

## 2022-04-27 ENCOUNTER — Ambulatory Visit (HOSPITAL_COMMUNITY)
Admission: RE | Admit: 2022-04-27 | Discharge: 2022-04-27 | Disposition: A | Discharge status: Home / Self Care | Payer: Medicare Other | Attending: Orthopedic Surgery | Admitting: Orthopedic Surgery

## 2022-04-27 ENCOUNTER — Ambulatory Visit (HOSPITAL_COMMUNITY): Payer: Medicare Other

## 2022-04-27 ENCOUNTER — Encounter (HOSPITAL_COMMUNITY): Payer: Self-pay | Admitting: Orthopedic Surgery

## 2022-04-27 DIAGNOSIS — K219 Gastro-esophageal reflux disease without esophagitis: Secondary | ICD-10-CM | POA: Diagnosis not present

## 2022-04-27 DIAGNOSIS — F17210 Nicotine dependence, cigarettes, uncomplicated: Secondary | ICD-10-CM | POA: Diagnosis not present

## 2022-04-27 DIAGNOSIS — Z9889 Other specified postprocedural states: Secondary | ICD-10-CM

## 2022-04-27 DIAGNOSIS — Z981 Arthrodesis status: Secondary | ICD-10-CM | POA: Diagnosis not present

## 2022-04-27 DIAGNOSIS — M8088XA Other osteoporosis with current pathological fracture, vertebra(e), initial encounter for fracture: Secondary | ICD-10-CM | POA: Insufficient documentation

## 2022-04-27 DIAGNOSIS — I739 Peripheral vascular disease, unspecified: Secondary | ICD-10-CM

## 2022-04-27 DIAGNOSIS — F17200 Nicotine dependence, unspecified, uncomplicated: Secondary | ICD-10-CM

## 2022-04-27 DIAGNOSIS — M8008XA Age-related osteoporosis with current pathological fracture, vertebra(e), initial encounter for fracture: Secondary | ICD-10-CM | POA: Insufficient documentation

## 2022-04-27 DIAGNOSIS — M8080XA Other osteoporosis with current pathological fracture, unspecified site, initial encounter for fracture: Secondary | ICD-10-CM | POA: Diagnosis not present

## 2022-04-27 DIAGNOSIS — M4856XA Collapsed vertebra, not elsewhere classified, lumbar region, initial encounter for fracture: Secondary | ICD-10-CM | POA: Diagnosis not present

## 2022-04-27 HISTORY — PX: KYPHOPLASTY: SHX5884

## 2022-04-27 SURGERY — KYPHOPLASTY
Anesthesia: General

## 2022-04-27 MED ORDER — LACTATED RINGERS IV SOLN: INTRAVENOUS | Status: DC

## 2022-04-27 MED ORDER — PROPOFOL 10 MG/ML IV BOLUS
INTRAVENOUS | Status: DC | PRN
  Administered 2022-04-27: 90 mg via INTRAVENOUS

## 2022-04-27 MED ORDER — FENTANYL CITRATE (PF) 100 MCG/2ML IJ SOLN: 25.0000 ug | INTRAMUSCULAR | Status: DC | PRN

## 2022-04-27 MED ORDER — IOPAMIDOL (ISOVUE-300) INJECTION 61%
INTRAVENOUS | Status: DC | PRN
  Administered 2022-04-27: 100 mL

## 2022-04-27 MED ORDER — AMISULPRIDE (ANTIEMETIC) 5 MG/2ML IV SOLN
INTRAVENOUS | Status: AC
  Administered 2022-04-27: 10 mg
  Filled 2022-04-27: qty 4

## 2022-04-27 MED ORDER — POVIDONE-IODINE 7.5 % EX SOLN
Freq: Once | CUTANEOUS | Status: DC
  Filled 2022-04-27: qty 118

## 2022-04-27 MED ORDER — METHOCARBAMOL 750 MG PO TABS: 750.0000 mg | ORAL_TABLET | Freq: Four times a day (QID) | ORAL | 0 refills | Status: AC | PRN

## 2022-04-27 MED ORDER — TRAMADOL HCL 50 MG PO TABS: 50.0000 mg | ORAL_TABLET | Freq: Four times a day (QID) | ORAL | 0 refills | Status: AC | PRN

## 2022-04-27 MED ORDER — EPHEDRINE 5 MG/ML INJ
INTRAVENOUS | Status: AC
  Filled 2022-04-27: qty 5

## 2022-04-27 MED ORDER — DEXAMETHASONE SODIUM PHOSPHATE 10 MG/ML IJ SOLN
INTRAMUSCULAR | Status: DC | PRN
  Administered 2022-04-27: 10 mg via INTRAVENOUS

## 2022-04-27 MED ORDER — DEXAMETHASONE SODIUM PHOSPHATE 10 MG/ML IJ SOLN
INTRAMUSCULAR | Status: AC
  Filled 2022-04-27: qty 3

## 2022-04-27 MED ORDER — BUPIVACAINE-EPINEPHRINE (PF) 0.25% -1:200000 IJ SOLN
INTRAMUSCULAR | Status: AC
  Filled 2022-04-27: qty 30

## 2022-04-27 MED ORDER — FENTANYL CITRATE (PF) 250 MCG/5ML IJ SOLN
INTRAMUSCULAR | Status: DC | PRN
  Administered 2022-04-27 (×2): 100 ug via INTRAVENOUS

## 2022-04-27 MED ORDER — CEFAZOLIN SODIUM-DEXTROSE 2-4 GM/100ML-% IV SOLN
2.0000 g | INTRAVENOUS | Status: AC
  Administered 2022-04-27: 2 g via INTRAVENOUS

## 2022-04-27 MED ORDER — ROCURONIUM BROMIDE 10 MG/ML (PF) SYRINGE
PREFILLED_SYRINGE | INTRAVENOUS | Status: DC | PRN
  Administered 2022-04-27: 60 mg via INTRAVENOUS

## 2022-04-27 MED ORDER — TRAMADOL HCL 50 MG PO TABS: 50.0000 mg | ORAL_TABLET | Freq: Four times a day (QID) | ORAL | Status: AC | PRN

## 2022-04-27 MED ORDER — FENTANYL CITRATE (PF) 250 MCG/5ML IJ SOLN
INTRAMUSCULAR | Status: AC
  Filled 2022-04-27: qty 5

## 2022-04-27 MED ORDER — 0.9 % SODIUM CHLORIDE (POUR BTL) OPTIME
TOPICAL | Status: DC | PRN
  Administered 2022-04-27: 1000 mL

## 2022-04-27 MED ORDER — PROMETHAZINE HCL 25 MG/ML IJ SOLN
INTRAMUSCULAR | Status: AC
  Filled 2022-04-27: qty 1

## 2022-04-27 MED ORDER — KETOROLAC TROMETHAMINE 30 MG/ML IJ SOLN
15.0000 mg | Freq: Once | INTRAMUSCULAR | Status: AC | PRN
  Administered 2022-04-27: 15 mg via INTRAVENOUS

## 2022-04-27 MED ORDER — PHENYLEPHRINE HCL-NACL 20-0.9 MG/250ML-% IV SOLN
INTRAVENOUS | Status: DC | PRN
  Administered 2022-04-27: 50 ug/min via INTRAVENOUS

## 2022-04-27 MED ORDER — CHLORHEXIDINE GLUCONATE 0.12 % MT SOLN
OROMUCOSAL | Status: AC
  Administered 2022-04-27: 15 mL via OROMUCOSAL
  Filled 2022-04-27: qty 15

## 2022-04-27 MED ORDER — LIDOCAINE 2% (20 MG/ML) 5 ML SYRINGE
INTRAMUSCULAR | Status: DC | PRN
  Administered 2022-04-27: 100 mg via INTRAVENOUS

## 2022-04-27 MED ORDER — ACETAMINOPHEN 500 MG PO TABS
ORAL_TABLET | ORAL | Status: AC
  Filled 2022-04-27: qty 2

## 2022-04-27 MED ORDER — CHLORHEXIDINE GLUCONATE 0.12 % MT SOLN: 15.0000 mL | Freq: Once | OROMUCOSAL | Status: AC

## 2022-04-27 MED ORDER — LIDOCAINE 2% (20 MG/ML) 5 ML SYRINGE
INTRAMUSCULAR | Status: AC
  Filled 2022-04-27: qty 15

## 2022-04-27 MED ORDER — ONDANSETRON HCL 4 MG/2ML IJ SOLN
INTRAMUSCULAR | Status: DC | PRN
  Administered 2022-04-27: 4 mg via INTRAVENOUS

## 2022-04-27 MED ORDER — ORAL CARE MOUTH RINSE: 15.0000 mL | Freq: Once | OROMUCOSAL | Status: AC

## 2022-04-27 MED ORDER — KETOROLAC TROMETHAMINE 15 MG/ML IJ SOLN
INTRAMUSCULAR | Status: AC
  Filled 2022-04-27: qty 1

## 2022-04-27 MED ORDER — ROCURONIUM BROMIDE 10 MG/ML (PF) SYRINGE
PREFILLED_SYRINGE | INTRAVENOUS | Status: AC
  Filled 2022-04-27: qty 30

## 2022-04-27 MED ORDER — CEFAZOLIN SODIUM-DEXTROSE 2-4 GM/100ML-% IV SOLN
INTRAVENOUS | Status: AC
  Filled 2022-04-27: qty 100

## 2022-04-27 MED ORDER — ONDANSETRON HCL 4 MG/2ML IJ SOLN
INTRAMUSCULAR | Status: DC
Start: 2022-04-27 — End: 2022-04-27
  Filled 2022-04-27: qty 2

## 2022-04-27 MED ORDER — SUGAMMADEX SODIUM 200 MG/2ML IV SOLN
INTRAVENOUS | Status: DC | PRN
  Administered 2022-04-27: 218 mg via INTRAVENOUS

## 2022-04-27 MED ORDER — BACITRACIN 500 UNIT/GM EX OINT
TOPICAL_OINTMENT | CUTANEOUS | Status: DC | PRN
  Administered 2022-04-27: 1 via TOPICAL

## 2022-04-27 MED ORDER — ONDANSETRON HCL 4 MG/2ML IJ SOLN
4.0000 mg | Freq: Once | INTRAMUSCULAR | Status: AC | PRN
Start: 2022-04-27 — End: 2022-04-27
  Administered 2022-04-27: 4 mg via INTRAVENOUS

## 2022-04-27 MED ORDER — AMISULPRIDE (ANTIEMETIC) 5 MG/2ML IV SOLN: 10.0000 mg | Freq: Once | INTRAVENOUS | Status: DC

## 2022-04-27 MED ORDER — ONDANSETRON HCL 4 MG/2ML IJ SOLN
INTRAMUSCULAR | Status: AC
  Filled 2022-04-27: qty 6

## 2022-04-27 MED ORDER — BACITRACIN ZINC 500 UNIT/GM EX OINT
TOPICAL_OINTMENT | CUTANEOUS | Status: AC
  Filled 2022-04-27: qty 28.35

## 2022-04-27 SURGICAL SUPPLY — 53 items
BAG COUNTER SPONGE SURGICOUNT (BAG) ×2 IMPLANT
BLADE SURG 15 STRL LF DISP TIS (BLADE) ×1 IMPLANT
BLADE SURG 15 STRL SS (BLADE) ×2
CEMENT KYPHON C01A KIT/MIXER (Cement) ×1 IMPLANT
CEMENT KYPHON CX01A KIT/MIXER (Cement) ×1 IMPLANT
COVER MAYO STAND STRL (DRAPES) ×2 IMPLANT
COVER SURGICAL LIGHT HANDLE (MISCELLANEOUS) ×1 IMPLANT
CURETTE EXPRESS SZ2 7MM (INSTRUMENTS) IMPLANT
CURRETTE EXPRESS SZ2 7MM (INSTRUMENTS)
DRAPE C-ARM 42X72 X-RAY (DRAPES) ×3 IMPLANT
DRAPE HALF SHEET 40X57 (DRAPES) ×1 IMPLANT
DRAPE INCISE IOBAN 66X45 STRL (DRAPES) ×2 IMPLANT
DRAPE LAPAROTOMY T 102X78X121 (DRAPES) ×2 IMPLANT
DRAPE SURG 17X23 STRL (DRAPES) ×4 IMPLANT
DRAPE WARM FLUID 44X44 (DRAPES) ×2 IMPLANT
DURAPREP 26ML APPLICATOR (WOUND CARE) ×2 IMPLANT
GAUZE 4X4 16PLY ~~LOC~~+RFID DBL (SPONGE) ×2 IMPLANT
GAUZE SPONGE 2X2 8PLY NS (GAUZE/BANDAGES/DRESSINGS) ×1 IMPLANT
GAUZE SPONGE 2X2 8PLY STRL LF (GAUZE/BANDAGES/DRESSINGS) ×1 IMPLANT
GLOVE BIO SURGEON STRL SZ7 (GLOVE) ×2 IMPLANT
GLOVE BIO SURGEON STRL SZ8 (GLOVE) ×2 IMPLANT
GLOVE BIOGEL PI IND STRL 7.0 (GLOVE) ×1 IMPLANT
GLOVE BIOGEL PI IND STRL 8 (GLOVE) ×1 IMPLANT
GLOVE BIOGEL PI INDICATOR 7.0 (GLOVE) ×1
GLOVE BIOGEL PI INDICATOR 8 (GLOVE) ×1
GLOVE SURG ENC MOIS LTX SZ6.5 (GLOVE) ×2 IMPLANT
GOWN STRL REUS W/ TWL LRG LVL3 (GOWN DISPOSABLE) ×2 IMPLANT
GOWN STRL REUS W/ TWL XL LVL3 (GOWN DISPOSABLE) ×1 IMPLANT
GOWN STRL REUS W/TWL LRG LVL3 (GOWN DISPOSABLE) ×4
GOWN STRL REUS W/TWL XL LVL3 (GOWN DISPOSABLE) ×2
INTRODUCER DEVICE OSTEO LEVEL (INTRODUCER) ×1 IMPLANT
KIT BASIN OR (CUSTOM PROCEDURE TRAY) ×2 IMPLANT
KIT OSTEOCOOL BONE ACCESS 8G (MISCELLANEOUS) ×1 IMPLANT
KIT TURNOVER KIT B (KITS) ×2 IMPLANT
NDL HYPO 25X1 1.5 SAFETY (NEEDLE) ×1 IMPLANT
NDL SPNL 18GX3.5 QUINCKE PK (NEEDLE) ×2 IMPLANT
NEEDLE 22X1 1/2 (OR ONLY) (NEEDLE) ×1 IMPLANT
NEEDLE HYPO 25X1 1.5 SAFETY (NEEDLE) ×2 IMPLANT
NEEDLE SPNL 18GX3.5 QUINCKE PK (NEEDLE) ×4 IMPLANT
NS IRRIG 1000ML POUR BTL (IV SOLUTION) ×2 IMPLANT
PACK UNIVERSAL I (CUSTOM PROCEDURE TRAY) ×2 IMPLANT
PAD ARMBOARD 7.5X6 YLW CONV (MISCELLANEOUS) ×4 IMPLANT
POSITIONER HEAD PRONE TRACH (MISCELLANEOUS) ×2 IMPLANT
SPONGE GAUZE 2X2 STER 10/PKG (GAUZE/BANDAGES/DRESSINGS) ×1
SUT MNCRL AB 4-0 PS2 18 (SUTURE) ×2 IMPLANT
SYR BULB IRRIG 60ML STRL (SYRINGE) ×2 IMPLANT
SYR CONTROL 10ML LL (SYRINGE) ×2 IMPLANT
TAMP BONE INFLATABLE SZ3 20MML (BALLOONS) ×2 IMPLANT
TAPE CLOTH SURG 4X10 WHT LF (GAUZE/BANDAGES/DRESSINGS) ×1 IMPLANT
TOWEL GREEN STERILE (TOWEL DISPOSABLE) ×2 IMPLANT
TOWEL GREEN STERILE FF (TOWEL DISPOSABLE) ×2 IMPLANT
TRAY KYPHOPAK 15/3 ONESTEP 1ST (MISCELLANEOUS) ×1 IMPLANT
TRAY KYPHOPAK 20/3 ONESTEP 1ST (MISCELLANEOUS) ×1 IMPLANT
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
## 2022-04-27 NOTE — Op Note (Signed)
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  SURGEON:  Estill Bamberg, MD.  ASSISTANTJason Coop, PA-C  ANESTHESIA:  General endotracheal anesthesia.  COMPLICATIONS:  None.  DISPOSITION:  Stable.  ESTIMATED BLOOD LOSS:  Minimal.  INDICATIONS FOR SURGERY:  Briefly, [name] is a very pleasant 81 y.o.-year-old female, who has been having pain in her back for the past 6 weeks. Her pain has been rather severe.  The patient's imaging studies did clearly reveal a osteoporotic compression fractures. Given her ongoing pain and dysfunction, we did discuss proceeding with the procedure noted above.  I did fully discuss the procedure with the patient, and she did wish to proceed.  OPERATIVE DETAILS:  On 04/27/2022, the patient was brought to surgery and general endotracheal anesthesia was administered.  The patient was placed prone on a well-padded flat Jackson bed with gel rolls placed under the patient's chest and hips.  Antibiotics were given.  AP and lateral fluoroscopy was brought into the field.  The L2, L3 and L4 pedicles were marked out in the usual fashion.  After a time-out procedure was performed, I did advance Jamshidis across the L2, L3, and L4 pedicles on the right and left sides.  I then drilled through the Jamshidis.  I then inserted kyphoplasty balloons and I was able to inflate the balloons with approximately 4cc of contrast at each level. The balloons were deflated then removed.  At this point, after the cement was mixed, a total of approximately 5cc of cement was injected through the right and left cannulas at each  level, half on the right, and half on the left.  Excellent interdigitation of cement was identified.  No abnormal extravasation was noted.  The cement was then allowed to harden, after which point the Jamshidis were removed.  The wound  was then irrigated and closed using 4-0 Monocryl.  Bacitracin and a sterile dressing were applied.  The patient was then awoken from general endotracheal anesthesia and transferred to recovery in stable condition.  Estill Bamberg, MD

## 2022-04-27 NOTE — Anesthesia Preprocedure Evaluation (Signed)
Anesthesia Evaluation  Patient identified by MRN, date of birth, ID band Patient awake    Reviewed: Allergy & Precautions, NPO status , Patient's Chart, lab work & pertinent test results  Airway Mallampati: II  TM Distance: >3 FB Neck ROM: Full    Dental no notable dental hx.    Pulmonary Current Smoker,    Pulmonary exam normal breath sounds clear to auscultation       Cardiovascular + Peripheral Vascular Disease  Normal cardiovascular exam Rhythm:Regular Rate:Normal     Neuro/Psych negative neurological ROS  negative psych ROS   GI/Hepatic Neg liver ROS, GERD  ,  Endo/Other  negative endocrine ROS  Renal/GU negative Renal ROS  negative genitourinary   Musculoskeletal negative musculoskeletal ROS (+)   Abdominal   Peds negative pediatric ROS (+)  Hematology negative hematology ROS (+)   Anesthesia Other Findings   Reproductive/Obstetrics negative OB ROS                             Anesthesia Physical Anesthesia Plan  ASA: 3  Anesthesia Plan: General   Post-op Pain Management: Minimal or no pain anticipated   Induction: Intravenous  PONV Risk Score and Plan: 2 and Ondansetron, Dexamethasone and Treatment may vary due to age or medical condition  Airway Management Planned: Oral ETT  Additional Equipment:   Intra-op Plan:   Post-operative Plan: Extubation in OR  Informed Consent: I have reviewed the patients History and Physical, chart, labs and discussed the procedure including the risks, benefits and alternatives for the proposed anesthesia with the patient or authorized representative who has indicated his/her understanding and acceptance.     Dental advisory given  Plan Discussed with: CRNA and Surgeon  Anesthesia Plan Comments:         Anesthesia Quick Evaluation

## 2022-04-27 NOTE — Anesthesia Postprocedure Evaluation (Signed)
Anesthesia Post Note  Patient: [name]  Procedure(s) Performed: LUMBAR two, LUMBAR three, LUMBAR four KYPHOPLASTY     Patient location during evaluation: PACU Anesthesia Type: General Level of consciousness: awake and alert Pain management: pain level controlled Vital Signs Assessment: post-procedure vital signs reviewed and stable Respiratory status: spontaneous breathing, nonlabored ventilation, respiratory function stable and patient connected to nasal cannula oxygen Cardiovascular status: blood pressure returned to baseline and stable Postop Assessment: no apparent nausea or vomiting Anesthetic complications: no   No notable events documented.  Last Vitals:  Vitals:   04/27/22 1223 04/27/22 1423  BP: (!) 133/51   Pulse: 63   Resp: 18 (!) 22  Temp: 37.1 C 36.7 C  SpO2: 98%     Last Pain:  Vitals:   04/27/22 1441  TempSrc:   PainSc: 0-No pain                 Darious Rehman S

## 2022-04-27 NOTE — Anesthesia Procedure Notes (Signed)
Procedure Name: Intubation Date/Time: 04/27/2022 1:00 PM  Performed by: Minerva Ends, CRNAPre-anesthesia Checklist: Patient identified, Emergency Drugs available, Suction available and Patient being monitored Patient Re-evaluated:Patient Re-evaluated prior to induction Oxygen Delivery Method: Circle system utilized Preoxygenation: Pre-oxygenation with 100% oxygen Induction Type: IV induction Ventilation: Mask ventilation without difficulty Laryngoscope Size: Mac and 3 Grade View: Grade I Tube type: Oral Tube size: 7.0 mm Number of attempts: 1 Airway Equipment and Method: Stylet and Oral airway Placement Confirmation: ETT inserted through vocal cords under direct vision, positive ETCO2 and breath sounds checked- equal and bilateral Secured at: 21 cm Tube secured with: Tape Dental Injury: Teeth and Oropharynx as per pre-operative assessment

## 2022-04-27 NOTE — Progress Notes (Signed)
Patient discharged at this time reports "Feeling Better"

## 2022-04-27 NOTE — Transfer of Care (Signed)
Immediate Anesthesia Transfer of Care Note  Patient: ENOLIA KOEPKE  Procedure(s) Performed: LUMBAR two, LUMBAR three, LUMBAR four KYPHOPLASTY  Patient Location: PACU  Anesthesia Type:General  Level of Consciousness: awake  Airway & Oxygen Therapy: Patient Spontanous Breathing and Patient connected to face mask oxygen  Post-op Assessment: Report given to RN and Post -op Vital signs reviewed and stable  Post vital signs: Reviewed and stable  Last Vitals:  Vitals Value Taken Time  BP 140/54 04/27/22 1423  Temp    Pulse 75 04/27/22 1427  Resp 14 04/27/22 1427  SpO2 100 % 04/27/22 1427  Vitals shown include unvalidated device data.  Last Pain:  Vitals:   04/27/22 1234  TempSrc:   PainSc: 8       Patients Stated Pain Goal: 3 (04/27/22 1234)  Complications: No notable events documented.

## 2022-04-27 NOTE — Progress Notes (Signed)
Patient up to recliner chair states "nausea is letting up"  patient is tolerating eating a cracker and drinking soda request to rest a little longer before discharge

## 2022-04-28 ENCOUNTER — Encounter (HOSPITAL_COMMUNITY): Payer: Self-pay | Admitting: Orthopedic Surgery

## 2022-05-10 DIAGNOSIS — S32010D Wedge compression fracture of first lumbar vertebra, subsequent encounter for fracture with routine healing: Secondary | ICD-10-CM | POA: Diagnosis not present

## 2022-05-15 ENCOUNTER — Encounter: Payer: Self-pay | Admitting: Family Medicine

## 2022-05-15 ENCOUNTER — Ambulatory Visit: Payer: Medicare Other | Admitting: Family Medicine

## 2022-05-15 VITALS — BP 100/52 | Ht <= 58 in | Wt 120.0 lb

## 2022-05-15 DIAGNOSIS — M8000XA Age-related osteoporosis with current pathological fracture, unspecified site, initial encounter for fracture: Secondary | ICD-10-CM | POA: Diagnosis not present

## 2022-05-15 DIAGNOSIS — S32020A Wedge compression fracture of second lumbar vertebra, initial encounter for closed fracture: Secondary | ICD-10-CM

## 2022-05-15 DIAGNOSIS — S32040A Wedge compression fracture of fourth lumbar vertebra, initial encounter for closed fracture: Secondary | ICD-10-CM | POA: Diagnosis not present

## 2022-05-15 DIAGNOSIS — S32030A Wedge compression fracture of third lumbar vertebra, initial encounter for closed fracture: Secondary | ICD-10-CM | POA: Diagnosis not present

## 2022-05-15 MED ORDER — CALCITONIN (SALMON) 200 UNIT/ACT NA SOLN: 1.0000 | Freq: Every day | NASAL | 1 refills | Status: AC

## 2022-05-15 NOTE — Assessment & Plan Note (Signed)
Acute on chronic in nature.  Has a history of compression fractures at T7, T8 and T12.  Has a history of kyphoplasty performed at T9 and T10.  Most recent compression fractures that are undergoing kyphoplasty occurring at L2, L3 and L4.  Has been on Fosamax previously but was intolerant.  Unknown of previous bone density. -Counseled on home exercise therapy and supportive care. -CMP, CBC, PTH and intact calcium, SPEP. -Bone density. -Pursue Evenity in the setting of multiple compression fractures with kyphoplasty and failure to tolerate oral medications.

## 2022-05-15 NOTE — Assessment & Plan Note (Signed)
More height loss with most recent MRI with ongoing back pain. -Counseled on home exercise therapy and supportive care. -Calcitonin -Pursue Evenity.

## 2022-05-15 NOTE — Patient Instructions (Signed)
Nice to meet you I will call with the lab results.  Please try the nasal spray  Please get the bone density performed Please send me a message in MyChart with any questions or updates.  We will call you with the benefits of the medication once we get that approved..   --Dr. Jordan Likes

## 2022-05-15 NOTE — Assessment & Plan Note (Signed)
Subacutely occurring on most recent MRI.  She is scheduled to have kyphoplasty performed. -Counseled on home exercise therapy and supportive care. -Calcitonin
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
## 2022-05-16 ENCOUNTER — Ambulatory Visit (HOSPITAL_BASED_OUTPATIENT_CLINIC_OR_DEPARTMENT_OTHER)
Admission: RE | Admit: 2022-05-16 | Discharge: 2022-05-16 | Disposition: A | Discharge status: Home / Self Care | Payer: Medicare Other | Source: Ambulatory Visit | Attending: Family Medicine | Admitting: Family Medicine

## 2022-05-16 DIAGNOSIS — M810 Age-related osteoporosis without current pathological fracture: Secondary | ICD-10-CM | POA: Diagnosis not present

## 2022-05-16 DIAGNOSIS — Z780 Asymptomatic menopausal state: Secondary | ICD-10-CM | POA: Insufficient documentation

## 2022-05-16 DIAGNOSIS — Z79890 Hormone replacement therapy (postmenopausal): Secondary | ICD-10-CM | POA: Insufficient documentation

## 2022-05-16 DIAGNOSIS — M85852 Other specified disorders of bone density and structure, left thigh: Secondary | ICD-10-CM | POA: Diagnosis not present

## 2022-05-16 DIAGNOSIS — M8000XA Age-related osteoporosis with current pathological fracture, unspecified site, initial encounter for fracture: Secondary | ICD-10-CM | POA: Insufficient documentation

## 2022-05-16 DIAGNOSIS — Z13820 Encounter for screening for osteoporosis: Secondary | ICD-10-CM | POA: Diagnosis not present

## 2022-05-17 ENCOUNTER — Telehealth: Payer: Self-pay | Admitting: *Deleted

## 2022-05-17 LAB — COMPREHENSIVE METABOLIC PANEL
ALT: 13 IU/L (ref 0–32)
AST: 21 IU/L (ref 0–40)
Albumin/Globulin Ratio: 2 (ref 1.2–2.2)
Albumin: 4.5 g/dL (ref 3.8–4.8)
Alkaline Phosphatase: 139 IU/L — ABNORMAL HIGH (ref 44–121)
BUN/Creatinine Ratio: 18 (ref 12–28)
BUN: 14 mg/dL (ref 8–27)
Bilirubin Total: 0.6 mg/dL (ref 0.0–1.2)
CO2: 24 mmol/L (ref 20–29)
Calcium: 9.8 mg/dL (ref 8.7–10.3)
Chloride: 102 mmol/L (ref 96–106)
Creatinine, Ser: 0.76 mg/dL (ref 0.57–1.00)
Globulin, Total: 2.2 g/dL (ref 1.5–4.5)
Glucose: 92 mg/dL (ref 70–99)
Potassium: 4.8 mmol/L (ref 3.5–5.2)
Sodium: 139 mmol/L (ref 134–144)
Total Protein: 6.7 g/dL (ref 6.0–8.5)
eGFR: 79 mL/min/{1.73_m2} (ref >59)

## 2022-05-17 LAB — PROTEIN ELECTROPHORESIS, SERUM
A/G Ratio: 1.2 (ref 0.7–1.7)
Albumin ELP: 3.6 g/dL (ref 2.9–4.4)
Alpha 1: 0.4 g/dL (ref 0.0–0.4)
Alpha 2: 0.9 g/dL (ref 0.4–1.0)
Beta: 1.1 g/dL (ref 0.7–1.3)
Gamma Globulin: 0.7 g/dL (ref 0.4–1.8)
Globulin, Total: 3.1 g/dL (ref 2.2–3.9)

## 2022-05-17 LAB — CBC
Hematocrit: 38.4 % (ref 34.0–46.6)
Hemoglobin: 13 g/dL (ref 11.1–15.9)
MCH: 29.3 pg (ref 26.6–33.0)
MCHC: 33.9 g/dL (ref 31.5–35.7)
MCV: 87 fL (ref 79–97)
Platelets: 259 10*3/uL (ref 150–450)
RBC: 4.44 x10E6/uL (ref 3.77–5.28)
RDW: 11.6 % — ABNORMAL LOW (ref 11.7–15.4)
WBC: 6.5 10*3/uL (ref 3.4–10.8)

## 2022-05-17 LAB — VITAMIN D 25 HYDROXY (VIT D DEFICIENCY, FRACTURES): Vit D, 25-Hydroxy: 34.4 ng/mL (ref 30.0–100.0)

## 2022-05-17 LAB — PTH, INTACT AND CALCIUM: PTH: 16 pg/mL (ref 15–65)

## 2022-05-17 NOTE — Telephone Encounter (Signed)
Patient called c/o her throat closing up x 3-5 days. Today she felt a choking sensation and nausea. She believes her methocarbamol and tramadol started this and the calcitonin spray exacerbated her symptoms. She denies trouble breathing.  I informed her to stop calcitonin nasal spray and see if her symptoms resolve. She will follow up as scheduled and call us back if she needs [place] sooner.

## 2022-05-17 NOTE — Telephone Encounter (Signed)
Noted.   Myra Rude, MD [facility] 05/17/2022, 1:25 PM

## 2022-05-18 ENCOUNTER — Telehealth: Payer: Self-pay | Admitting: Family Medicine

## 2022-05-18 NOTE — Telephone Encounter (Signed)
Informed of results  Myra Rude, MD [facility] 05/18/2022, 8:39 AM

## 2022-06-01 ENCOUNTER — Telehealth: Payer: Self-pay | Admitting: *Deleted

## 2022-06-01 NOTE — Telephone Encounter (Signed)
-----   Message from Carl Best sent at 06/01/2022 11:00 AM EDT ----- Regarding: Pt cld ask for Results? & Recommendation of Wt'd belt? Pt called states she was waiting on Results ( but Doc gv them to her 7/20).. she also ask if provider can recommend she wear a weighted  typo belt???? (Unsure of what that is ) but patient says he should know what it is.  Please contact pt [phone] (H) if any questions or concerns   Thx, glh

## 2022-06-01 NOTE — Telephone Encounter (Addendum)
I called pt- she is requesting call back with DEXA results and she also wants to know if you think she will benefit from using a Kypho orthosis full back brace, she found online with very good reviews. She c/o rib pain. Please advise.

## 2022-06-05 DIAGNOSIS — M48062 Spinal stenosis, lumbar region with neurogenic claudication: Secondary | ICD-10-CM | POA: Diagnosis not present

## 2022-06-05 NOTE — Telephone Encounter (Signed)
Left VM for patient. If she calls back please have her speak with a nurse/CMA and inform that her does consent is showing a -3.9.  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  We can try a brace with [employer] if she would like to try that where she is custom fitted..   If any questions then please take the best time and phone number to call and I will try to call her back.   Myra Rude, MD [facility] 06/05/2022, 10:19 AM

## 2022-06-06 ENCOUNTER — Telehealth: Payer: Self-pay

## 2022-06-06 NOTE — Telephone Encounter (Signed)
Prolia VOB initiated via [email or web address] ? ?New start ? ?

## 2022-06-06 NOTE — Telephone Encounter (Signed)
Needs VOB for EVENITY  [name] is a 81 y.o. female that is presenting with multiple compression fractures in the setting of osteoporosis.  The most recent MRI from 04/08/2022 shows subacute compression fractures at L2, L3 and L4.  She has tried Fosamax in the past but did not tolerate due to anaphylaxis.  She also has a kyphoplasty performed at T9 and T10 with historical compression fractures at T7, T8 and T12.  Compression fracture of L3 lumbar vertebra, closed, initial encounter (HCC) Subacutely occurring on most recent MRI.  She is scheduled to have kyphoplasty performed. -Counseled on home exercise therapy and supportive care. -Calcitonin   Compression fracture of L4 lumbar vertebra, closed, initial encounter (HCC) More height loss with most recent MRI with ongoing back pain. -Counseled on home exercise therapy and supportive care. -Calcitonin -Pursue Evenity.   Age-related osteoporosis with current pathological fracture Acute on chronic in nature.  Has a history of compression fractures at T7, T8 and T12.  Has a history of kyphoplasty performed at T9 and T10.  Most recent compression fractures that are undergoing kyphoplasty occurring at L2, L3 and L4.  Has been on Fosamax previously but was intolerant.  Unknown of previous bone density. -Counseled on home exercise therapy and supportive care. -CMP, CBC, PTH and intact calcium, SPEP. -Bone density. -Pursue Evenity in the setting of multiple compression fractures with kyphoplasty and failure to tolerate oral medications.

## 2022-06-09 NOTE — Telephone Encounter (Signed)
Prior auth required for EVENITY  PA PROCESS DETAILS: Prior Authorization is Required. We are unable to confirm if a PA is on file. Please check your records or contact the payer.  

## 2022-06-23 NOTE — Telephone Encounter (Signed)
Pt informed of below.  She is going to the [facility] clinic for a custom brace.

## 2022-06-23 NOTE — Telephone Encounter (Signed)
I spoke to pt- I informed her of below. She is going to check with her daughters about helping with the cost of Evenity. She will call us back.    [name],  Will you reach out to pt and let them know that monthly out-of-pocket cost will be around $443 for Evenity. If patient is OK with cost I will attempt prior authorization.   Thanks!

## 2022-06-26 NOTE — Telephone Encounter (Signed)
Clerance Lav, CMA Pt cld back to inform Misty Stanley she cannot afford the Evenity injections if she has to pay $443 each time she gets them-- forwarding message to med asst for review w/ provider.   --Patient ask for a call back.

## 2022-06-29 NOTE — Telephone Encounter (Signed)
Spoke with patient. Try to verify benefits for prolia.   Myra Rude, MD [facility] 06/29/2022, 3:40 PM

## 2022-06-30 DIAGNOSIS — M546 Pain in thoracic spine: Secondary | ICD-10-CM | POA: Diagnosis not present

## 2022-07-04 NOTE — Telephone Encounter (Signed)
Prolia VOB initiated via [email or web address]  New start Lumbar fracture

## 2022-07-11 ENCOUNTER — Other Ambulatory Visit: Payer: Self-pay | Admitting: Family Medicine

## 2022-07-17 NOTE — Telephone Encounter (Signed)
Prior auth required for PROLIA  PA PROCESS DETAILS: Effective 10/30/2021 if the patient is new to Prolia, Prior authorization and Step Therapy are required & not on file. Please go to [email or web address] or call [phone] to initiate the prior authorization. For exception to the policy please visit [email or web address] and review Policy Number [id number] 

## 2022-07-31 NOTE — Telephone Encounter (Signed)
Brandy- Dr. Raeford Razor wanted pt to have Evenity. [facility] called. The Josem Kaufmann was for Prolia, but clinicals state Evenity. Can you resubmit for [place], [id number]?

## 2022-07-31 NOTE — Telephone Encounter (Signed)
Prior Authorization initiated for [facility] via [facility] Provider portal.  Case ID: [id number]

## 2022-08-09 NOTE — Telephone Encounter (Signed)
Weber Cooks,  Please see note below and confirm with Dr. Sherron Flemings vs [place] [place]

## 2022-08-20 NOTE — Telephone Encounter (Signed)
Prior Authorization initiated for [facility] via [facility] Provider portal.  Case ID: [id number]

## 2022-08-29 NOTE — Telephone Encounter (Addendum)
PA# [id number] Valid: 08/20/22-08/21/23

## 2022-08-29 NOTE — Telephone Encounter (Signed)
Pt ready for scheduling on or after 08/29/22  Out-of-pocket cost due at time of visit: $296  Primary: [place] [employer] Adv HMO-POS Prolia co-insurance: 20% (approximately $276) Admin fee co-insurance: $20  Secondary: n/a Prolia co-insurance:  Admin fee co-insurance:   Deductible: does not apply  Prior Auth: APPROVED PA# [id number] Valid: 08/20/22-08/21/23  ** This summary of benefits is an estimation of the patient's out-of-pocket cost. Exact cost may vary based on individual plan coverage.

## 2022-08-30 NOTE — Telephone Encounter (Signed)
Pt informed of below.  She declines staring Prolia at this time due to severe side effects she has heard her friends having from [place]. She wants to know if she can continue calcitonin nasal spray instead. Please advise.

## 2022-09-19 NOTE — Telephone Encounter (Signed)
I called pt- she declines starting any new therapy for her osteoporosis. She states she will continue taking her calcium and vitamin D and be very careful.

## 2023-02-12 ENCOUNTER — Encounter: Payer: Self-pay | Admitting: *Deleted
